# Patient Record
Sex: Female | Born: 1935 | Race: White | Hispanic: No | Marital: Married | Smoking: Never smoker
Health system: Southern US, Community
[De-identification: ages and names within clinical notes are randomized; demographics above are authoritative.]

## PROBLEM LIST (undated history)

## (undated) DIAGNOSIS — I82409 Acute embolism and thrombosis of unspecified deep veins of unspecified lower extremity: Secondary | ICD-10-CM

## (undated) DIAGNOSIS — M1990 Unspecified osteoarthritis, unspecified site: Secondary | ICD-10-CM

## (undated) HISTORY — PX: OTHER SURGICAL HISTORY: SHX169

## (undated) HISTORY — PX: BREAST SURGERY: SHX581

## (undated) HISTORY — PX: ABDOMINAL HYSTERECTOMY: SHX81

## (undated) HISTORY — PX: CHOLECYSTECTOMY: SHX55

---

## 2004-04-01 ENCOUNTER — Other Ambulatory Visit: Payer: Self-pay

## 2004-04-11 ENCOUNTER — Other Ambulatory Visit: Payer: Self-pay

## 2004-12-19 ENCOUNTER — Ambulatory Visit: Payer: Self-pay | Admitting: Internal Medicine

## 2005-12-23 ENCOUNTER — Ambulatory Visit: Payer: Self-pay | Admitting: Internal Medicine

## 2007-02-24 ENCOUNTER — Ambulatory Visit: Payer: Self-pay | Admitting: Internal Medicine

## 2008-03-24 ENCOUNTER — Ambulatory Visit: Payer: Self-pay | Admitting: Internal Medicine

## 2009-05-17 ENCOUNTER — Ambulatory Visit: Payer: Self-pay | Admitting: Internal Medicine

## 2014-09-28 ENCOUNTER — Emergency Department: Payer: Self-pay | Admitting: Emergency Medicine

## 2015-01-29 ENCOUNTER — Emergency Department: Payer: Medicare Other

## 2015-01-29 ENCOUNTER — Inpatient Hospital Stay
Admission: EM | Admit: 2015-01-29 | Discharge: 2015-01-31 | DRG: 166 | Disposition: A | Discharge status: Home / Self Care | Payer: Medicare Other | Attending: Internal Medicine | Admitting: Internal Medicine

## 2015-01-29 ENCOUNTER — Inpatient Hospital Stay: Payer: Medicare Other

## 2015-01-29 ENCOUNTER — Encounter: Payer: Self-pay | Admitting: Emergency Medicine

## 2015-01-29 ENCOUNTER — Inpatient Hospital Stay (HOSPITAL_COMMUNITY)
Admit: 2015-01-29 | Discharge: 2015-01-29 | Disposition: A | Discharge status: Home / Self Care | Payer: Medicare Other | Attending: Internal Medicine | Admitting: Internal Medicine

## 2015-01-29 DIAGNOSIS — I10 Essential (primary) hypertension: Secondary | ICD-10-CM | POA: Diagnosis present

## 2015-01-29 DIAGNOSIS — R0602 Shortness of breath: Secondary | ICD-10-CM

## 2015-01-29 DIAGNOSIS — Z79899 Other long term (current) drug therapy: Secondary | ICD-10-CM

## 2015-01-29 DIAGNOSIS — Z90710 Acquired absence of both cervix and uterus: Secondary | ICD-10-CM

## 2015-01-29 DIAGNOSIS — I351 Nonrheumatic aortic (valve) insufficiency: Secondary | ICD-10-CM

## 2015-01-29 DIAGNOSIS — I2699 Other pulmonary embolism without acute cor pulmonale: Secondary | ICD-10-CM | POA: Diagnosis present

## 2015-01-29 DIAGNOSIS — Z95828 Presence of other vascular implants and grafts: Secondary | ICD-10-CM

## 2015-01-29 DIAGNOSIS — J9601 Acute respiratory failure with hypoxia: Secondary | ICD-10-CM | POA: Diagnosis present

## 2015-01-29 DIAGNOSIS — Z9049 Acquired absence of other specified parts of digestive tract: Secondary | ICD-10-CM | POA: Diagnosis present

## 2015-01-29 DIAGNOSIS — Z7982 Long term (current) use of aspirin: Secondary | ICD-10-CM | POA: Diagnosis not present

## 2015-01-29 DIAGNOSIS — I82403 Acute embolism and thrombosis of unspecified deep veins of lower extremity, bilateral: Secondary | ICD-10-CM | POA: Diagnosis present

## 2015-01-29 DIAGNOSIS — Z86718 Personal history of other venous thrombosis and embolism: Secondary | ICD-10-CM | POA: Diagnosis not present

## 2015-01-29 DIAGNOSIS — M1990 Unspecified osteoarthritis, unspecified site: Secondary | ICD-10-CM | POA: Diagnosis present

## 2015-01-29 DIAGNOSIS — I248 Other forms of acute ischemic heart disease: Secondary | ICD-10-CM | POA: Diagnosis present

## 2015-01-29 DIAGNOSIS — Z66 Do not resuscitate: Secondary | ICD-10-CM | POA: Diagnosis present

## 2015-01-29 DIAGNOSIS — J9600 Acute respiratory failure, unspecified whether with hypoxia or hypercapnia: Secondary | ICD-10-CM | POA: Diagnosis present

## 2015-01-29 DIAGNOSIS — Z9181 History of falling: Secondary | ICD-10-CM | POA: Diagnosis not present

## 2015-01-29 DIAGNOSIS — I82409 Acute embolism and thrombosis of unspecified deep veins of unspecified lower extremity: Secondary | ICD-10-CM | POA: Diagnosis present

## 2015-01-29 HISTORY — DX: Acute embolism and thrombosis of unspecified deep veins of unspecified lower extremity: I82.409

## 2015-01-29 HISTORY — DX: Unspecified osteoarthritis, unspecified site: M19.90

## 2015-01-29 LAB — COMPREHENSIVE METABOLIC PANEL
ALK PHOS: 64 U/L (ref 38–126)
ALT: 15 U/L (ref 14–54)
AST: 25 U/L (ref 15–41)
Albumin: 3.7 g/dL (ref 3.5–5.0)
Anion gap: 11 (ref 5–15)
BUN: 14 mg/dL (ref 6–20)
CO2: 25 mmol/L (ref 22–32)
CREATININE: 0.74 mg/dL (ref 0.44–1.00)
Calcium: 8.9 mg/dL (ref 8.9–10.3)
Chloride: 103 mmol/L (ref 101–111)
GFR calc Af Amer: >60 mL/min (ref >60)
Glucose, Bld: 314 mg/dL — ABNORMAL HIGH (ref 65–99)
POTASSIUM: 3.8 mmol/L (ref 3.5–5.1)
Sodium: 139 mmol/L (ref 135–145)
Total Bilirubin: 0.6 mg/dL (ref 0.3–1.2)
Total Protein: 6.6 g/dL (ref 6.5–8.1)

## 2015-01-29 LAB — CBC
HEMATOCRIT: 39.7 % (ref 35.0–47.0)
Hemoglobin: 12.7 g/dL (ref 12.0–16.0)
MCH: 27.5 pg (ref 26.0–34.0)
MCHC: 31.9 g/dL — ABNORMAL LOW (ref 32.0–36.0)
MCV: 86.1 fL (ref 80.0–100.0)
Platelets: 208 10*3/uL (ref 150–440)
RBC: 4.61 MIL/uL (ref 3.80–5.20)
RDW: 15.6 % — ABNORMAL HIGH (ref 11.5–14.5)
WBC: 9.6 10*3/uL (ref 3.6–11.0)

## 2015-01-29 LAB — PROTIME-INR
INR: 1.05
Prothrombin Time: 13.9 seconds (ref 11.4–15.0)

## 2015-01-29 LAB — FIBRIN DERIVATIVES D-DIMER (ARMC ONLY)

## 2015-01-29 LAB — CK: Total CK: 51 U/L (ref 38–234)

## 2015-01-29 LAB — APTT: aPTT: 24 seconds (ref 24–36)

## 2015-01-29 LAB — TROPONIN I
Troponin I: 0.12 ng/mL — ABNORMAL HIGH (ref <0.031)
Troponin I: 1.18 ng/mL — ABNORMAL HIGH (ref <0.031)
Troponin I: 0.82 ng/mL — ABNORMAL HIGH (ref <0.031)

## 2015-01-29 LAB — HEPARIN LEVEL (UNFRACTIONATED): HEPARIN UNFRACTIONATED: 0.77 [IU]/mL — AB (ref 0.30–0.70)

## 2015-01-29 MED ORDER — MORPHINE SULFATE 2 MG/ML IJ SOLN: 2.0000 mg | INTRAMUSCULAR | Status: DC | PRN

## 2015-01-29 MED ORDER — IPRATROPIUM-ALBUTEROL 0.5-2.5 (3) MG/3ML IN SOLN
3.0000 mL | Freq: Four times a day (QID) | RESPIRATORY_TRACT | Status: DC
  Administered 2015-01-29 – 2015-01-31 (×7): 3 mL via RESPIRATORY_TRACT
  Filled 2015-01-29 (×8): qty 3

## 2015-01-29 MED ORDER — DOCUSATE SODIUM 100 MG PO CAPS
100.0000 mg | ORAL_CAPSULE | Freq: Two times a day (BID) | ORAL | Status: DC
  Administered 2015-01-29 – 2015-01-31 (×5): 100 mg via ORAL
  Filled 2015-01-29 (×5): qty 1

## 2015-01-29 MED ORDER — ONDANSETRON HCL 4 MG/2ML IJ SOLN: 4.0000 mg | Freq: Four times a day (QID) | INTRAMUSCULAR | Status: DC | PRN

## 2015-01-29 MED ORDER — PANTOPRAZOLE SODIUM 40 MG IV SOLR
40.0000 mg | Freq: Two times a day (BID) | INTRAVENOUS | Status: DC
  Administered 2015-01-29 – 2015-01-31 (×5): 40 mg via INTRAVENOUS
  Filled 2015-01-29 (×5): qty 40

## 2015-01-29 MED ORDER — HEPARIN BOLUS VIA INFUSION
3500.0000 [IU] | Freq: Once | INTRAVENOUS | Status: AC
Start: 2015-01-29 — End: 2015-01-29
  Administered 2015-01-29: 3500 [IU] via INTRAVENOUS
  Filled 2015-01-29: qty 3500

## 2015-01-29 MED ORDER — IOHEXOL 350 MG/ML SOLN
75.0000 mL | Freq: Once | INTRAVENOUS | Status: AC | PRN
  Administered 2015-01-29: 75 mL via INTRAVENOUS

## 2015-01-29 MED ORDER — BISACODYL 10 MG RE SUPP: 10.0000 mg | Freq: Every day | RECTAL | Status: DC | PRN

## 2015-01-29 MED ORDER — ACETAMINOPHEN 650 MG RE SUPP: 650.0000 mg | Freq: Four times a day (QID) | RECTAL | Status: DC | PRN

## 2015-01-29 MED ORDER — ACETAMINOPHEN 325 MG PO TABS: 650.0000 mg | ORAL_TABLET | Freq: Four times a day (QID) | ORAL | Status: DC | PRN

## 2015-01-29 MED ORDER — IPRATROPIUM-ALBUTEROL 0.5-2.5 (3) MG/3ML IN SOLN
RESPIRATORY_TRACT | Status: AC
  Administered 2015-01-29: 3 mL
  Filled 2015-01-29: qty 3

## 2015-01-29 MED ORDER — SODIUM CHLORIDE 0.9 % IV SOLN
INTRAVENOUS | Status: DC
  Administered 2015-01-29: 14:00:00 via INTRAVENOUS

## 2015-01-29 MED ORDER — ONDANSETRON HCL 4 MG PO TABS: 4.0000 mg | ORAL_TABLET | Freq: Four times a day (QID) | ORAL | Status: DC | PRN

## 2015-01-29 MED ORDER — HEPARIN SODIUM (PORCINE) 5000 UNIT/ML IJ SOLN
INTRAMUSCULAR | Status: AC
  Filled 2015-01-29: qty 1

## 2015-01-29 MED ORDER — SODIUM CHLORIDE 0.9 % IJ SOLN
3.0000 mL | Freq: Two times a day (BID) | INTRAMUSCULAR | Status: DC
  Administered 2015-01-29 – 2015-01-31 (×5): 3 mL via INTRAVENOUS

## 2015-01-29 MED ORDER — ASPIRIN EC 325 MG PO TBEC
325.0000 mg | DELAYED_RELEASE_TABLET | ORAL | Status: DC
  Administered 2015-01-30 – 2015-01-31 (×2): 325 mg via ORAL
  Filled 2015-01-29 (×2): qty 1

## 2015-01-29 MED ORDER — CEFTRIAXONE SODIUM IN DEXTROSE 20 MG/ML IV SOLN
1.0000 g | INTRAVENOUS | Status: DC
  Administered 2015-01-29: 1 g via INTRAVENOUS
  Filled 2015-01-29 (×3): qty 50

## 2015-01-29 MED ORDER — HEPARIN (PORCINE) IN NACL 100-0.45 UNIT/ML-% IJ SOLN
950.0000 [IU]/h | INTRAMUSCULAR | Status: DC
  Administered 2015-01-29: 1100 [IU]/h via INTRAVENOUS
  Administered 2015-01-30: 950 [IU]/h via INTRAVENOUS
  Filled 2015-01-29 (×5): qty 250

## 2015-01-29 NOTE — Progress Notes (Signed)
Dr. Doy Hutching made aware of 2nd troponin of 1.18 and + venous dopplers.  Orders received.

## 2015-01-29 NOTE — ED Notes (Signed)
Report given to Lavella Lemons, Therapist, sports.  Patient in [place], will transfer patient to floor once ultrasound is complete.

## 2015-01-29 NOTE — ED Provider Notes (Signed)
[facility] Emergency Department Provider Note  ____________________________________________  Time seen: 10:05 AM  I have reviewed the triage vital signs and the nursing notes.   HISTORY  Chief Complaint Fall and Shortness of Breath      HPI Courtney Simon is a 81 y.o. female presents with a history of syncopal episode this morning after using the bathroom. Patient states she urinated subsequently stood up and lost consciousness. Per patient's husband on his arrival to the bathroom the patient was incoherent eyes were open but was not responsive. In addition patient has been noted that she was breathing rapidly. Patient presents to the emergency department tachypnea Tachycardic in  apparent distress. Of note patient has a history of DVTs in the left lower extremity "many years ago".     Past Medical History  Diagnosis Date  . DVT (deep venous thrombosis)   . Arthritis     Patient Active Problem List   Diagnosis Date Noted  . Pulmonary embolism 01/29/2015  . Acute respiratory failure 01/29/2015  . SOB (shortness of breath) 01/29/2015    Past Surgical History  Procedure Laterality Date  . Breast surgery    . Abdominal hysterectomy    . Cholecystectomy      Current Outpatient Rx  Name  Route  Sig  Dispense  Refill  . aspirin EC 325 MG tablet   Oral   Take 325 mg by mouth every morning.         . Aspirin-Salicylamide-Caffeine (ARTHRITIS STRENGTH BC POWDER PO)   Oral   Take 1 packet by mouth daily as needed. For pain.           Allergies Review of patient's allergies indicates no known allergies.  History reviewed. No pertinent family history.  Social History History  Substance Use Topics  . Smoking status: Never Smoker   . Smokeless tobacco: Not on file  . Alcohol Use: No    Review of Systems  Constitutional: Negative for fever. Eyes: Negative for visual changes. ENT: Negative for sore throat. Cardiovascular: Positive for  chest pain. Respiratory: Positive for shortness of breath. Gastrointestinal: Negative for abdominal pain, vomiting and diarrhea. Genitourinary: Negative for dysuria. Musculoskeletal: Negative for back pain. Skin: Negative for rash. Neurological: Negative for headaches, focal weakness or numbness.   10-point ROS otherwise negative.  ____________________________________________   PHYSICAL EXAM:  VITAL SIGNS: ED Triage Vitals  Enc Vitals Group     BP 01/29/15 0950 144/60 mmHg     Pulse Rate 01/29/15 0950 109     Resp 01/29/15 0950 20     Temp 01/29/15 0950 97.7 F (36.5 C)     Temp Source 01/29/15 0950 Oral     SpO2 01/29/15 0950 95 %     Weight 01/29/15 0950 153 lb (69.4 kg)     Height 01/29/15 0950 5\' 6"  (1.676 m)     Head Cir --      Peak Flow --      Pain Score 01/29/15 1104 0     Pain Loc --      Pain Edu? --      Excl. in [place]? --      Constitutional: Alert and oriented. Apparent distress Eyes: Conjunctivae are normal. PERRL. Normal extraocular movements. ENT   Head: Normocephalic and atraumatic.   Nose: No congestion/rhinnorhea.   Mouth/Throat: Mucous membranes are moist.   Neck: No stridor. Cardiovascular: Sinus tachycardia. Normal and symmetric distal pulses are present in all extremities. No murmurs, rubs, or  gallops. Respiratory: Normal respiratory effort without tachypnea nor retractions. Breath sounds are clear and equal bilaterally. No wheezes/rales/rhonchi. Gastrointestinal: Soft and nontender. No distention. There is no CVA tenderness. Genitourinary: deferred Musculoskeletal: Nontender with normal range of motion in all extremities. No joint effusions.  No lower extremity tenderness nor edema. Neurologic:  Normal speech and language. No gross focal neurologic deficits are appreciated. Speech is normal.  Skin:  Skin is warm, dry and intact. No rash noted. Psychiatric: Mood and affect are normal. Speech and behavior are normal. Patient exhibits  appropriate insight and judgment.  ____________________________________________    LABS (pertinent positives/negatives)  Labs Reviewed  CBC - Abnormal; Notable for the following:    MCHC 31.9 (*)    RDW 15.6 (*)    All other components within normal limits  COMPREHENSIVE METABOLIC PANEL - Abnormal; Notable for the following:    Glucose, Bld 314 (*)    All other components within normal limits  TROPONIN I - Abnormal; Notable for the following:    Troponin I 0.12 (*)    All other components within normal limits  FIBRIN DERIVATIVES D-DIMER ([facility] ONLY) - Abnormal; Notable for the following:    Fibrin derivatives D-dimer ([facility]) >10000 (*)    All other components within normal limits  CK  HEPARIN LEVEL (UNFRACTIONATED)     ____________________________________________   EKG  ED ECG REPORT I, Zian Delair, Florence N, the attending physician, personally viewed and interpreted this ECG.   Date: 01/29/2015  EKG Time: 9:48 AM  Rate: 111  Rhythm: Sinus tachycardia  Axis: Right axis deviation  Intervals:normal  ST&T Change: none   ____________________________________________    RADIOLOGY  CT chest revealed:   IMPRESSION: 1. Extensive bilateral pulmonary emboli. Positive for acute PE with CT evidence of right heart strain (RV/LV Ratio = 1.17) consistent with at least submassive (intermediate risk) PE. The presence of right heart strain has been associated with an increased risk of morbidity and mortality. Critical Value/emergent results were called by telephone at the time of interpretation on 01/29/2015 at 12:33 pm to Dr. Marjean Donna , who verbally acknowledged these results. 2. Bilateral mild interstitial thickening and patchy areas of ground-glass opacity as can be seen with mild pulmonary edema. ____________________________________________   Critical Care performed: 60 minutes  ____________________________________________   INITIAL IMPRESSION / ASSESSMENT AND  PLAN / ED COURSE  Pertinent labs & imaging results that were available during my care of the patient were reviewed by me and considered in my medical decision making (see chart for details).  History physical exam and CAT scan consistent with multiple pulmonary emboli bilateral lungs with right heart strain. Patient given Lovenox 1 mg/kg subcutaneous. Patient was discussed with Dr. Doy Hutching regarding all clinical findings with plan for admission for continued anticoagulation therapy.  ____________________________________________   FINAL CLINICAL IMPRESSION(S) / ED DIAGNOSES  Final diagnoses:  Pulmonary embolism      Gregor Hams, MD 01/29/15 1314

## 2015-01-29 NOTE — Clinical Social Work Note (Signed)
Clinical Social Work Assessment  Patient Details  Name: Courtney Simon MRN: [id number] Date of Birth: July 17, 1921  Date of referral:  01/29/15               Reason for consult:  Discharge Planning                Permission sought to share information with:  Family Supports Permission granted to share information::  Yes, Verbal Permission Granted  Name::     Mikki Santee [phone]   and Maris Berger [phone] hm   [phone] cell (husband Mikki Santee and daughter Webb Silversmith)  Agency::     Relationship::     Contact Information:     Housing/Transportation Living arrangements for the past 2 months:  [place] of Information:  Patient, Adult Children, Spouse Patient Interpreter Needed:  None Criminal Activity/Legal Involvement Pertinent to Current Situation/Hospitalization:  No - Comment as needed Significant Relationships:  Adult Children, Other Family Members, Spouse Lives with:  Spouse Do you feel safe going back to the place where you live?  Yes Need for family participation in patient care:  Yes (Comment)  Care giving concerns:  No concerns reported at this time   Social Worker assessment / plan:  Patient reported to ED due to syncopal episode. Patient is hard of hearing, she is pleasant and references for her husband to provide all information.  Husband and daughter at bedside.  Patient lives with her husband of 60 years.  Their daughter Webb Silversmith lives next door and is a support system for patient.  Patient was very active, she did not utilize any assistance devices and enjoyed getting out prior to this episode.  Patient enjoys cleaning her home, cooking and doing laundry.  Patient has never been to rehab nor had home health services.  However, patient and family are open to all recommendation from MD and PT .  Disposition to be determined   Employment status:  Retired Forensic scientist:    PT Recommendations:  Not assessed at this time Information / Referral to community  resources:  Other (Comment Required) (pending)  Patient/Family's Response to care:  patient and family are open to all recommendation from MD and PT   Patient/Family's Understanding of and Emotional Response to Diagnosis, Current Treatment, and Prognosis:  Patient and family understand patient will be admitted and evaluated.   Emotional Assessment Appearance:  Appears stated age Attitude/Demeanor/Rapport:  Other (cooperative) Affect (typically observed):  Appropriate, Calm, Pleasant, Quiet Orientation:  Oriented to Self, Oriented to Place, Oriented to  Time, Oriented to Situation Alcohol / Substance use:  Never Used Psych involvement (Current and /or in the community):  No (Comment)  Discharge Needs  Concerns to be addressed:  Denies Needs/Concerns at this time Readmission within the last 30 days:  No Current discharge risk:  None (none identified at this time) Barriers to Discharge:  No Barriers Identified   Maurine Cane, LCSW 01/29/2015, 2:30 PM Casimer Lanius. Latanya Presser, MSW Clinical Social Work Department Emergency Room [phone] 2:37 PM

## 2015-01-29 NOTE — H&P (Signed)
History and Physical    Courtney Simon CMK:[id number] DOB: [birth year] DOA: 01/29/2015  Referring physician: Dr. Owens Shark PCP: Madelyn Brunner, MD  Specialists: none   Chief Complaint: SOB, CP  HPI: Courtney Simon is a 81 y.o. female has a past medical history significant for OA and remote hx of DVT now with acute onset of SOB, CP and tachypnea found to have submassive PE on CT in ER with right heart strain. Has remote hx of DVT treated with Lovenox. Now admitted for further evaluation.  Review of Systems: The patient denies anorexia, fever, weight loss,, vision loss, decreased hearing, hoarseness, chest pain, syncope, dyspnea on exertion, peripheral edema, balance deficits, hemoptysis, abdominal pain, melena, hematochezia, severe indigestion/heartburn, hematuria, incontinence, genital sores, muscle weakness, suspicious skin lesions, transient blindness, difficulty walking, depression, unusual weight change, abnormal bleeding, enlarged lymph nodes, angioedema, and breast masses.   Past Medical History  Diagnosis Date  . DVT (deep venous thrombosis)   . Arthritis    Past Surgical History  Procedure Laterality Date  . Breast surgery    . Abdominal hysterectomy    . Cholecystectomy     Social History:  reports that she has never smoked. She does not have any smokeless tobacco history on file. She reports that she does not drink alcohol. Her drug history is not on file.  No Known Allergies  History reviewed. No pertinent family history.  Prior to Admission medications   Medication Sig Start Date End Date Taking? Authorizing Provider  aspirin EC 325 MG tablet Take 325 mg by mouth every morning.   Yes Historical Provider, MD  Aspirin-Salicylamide-Caffeine (ARTHRITIS STRENGTH BC POWDER PO) Take 1 packet by mouth daily as needed. For pain.   Yes Historical Provider, MD   Physical Exam: Filed Vitals:   01/29/15 0950 01/29/15 1104  BP: 144/60 139/81  Pulse: 109 107  Temp: 97.7 F  (36.5 C)   TempSrc: Oral   Resp: 20 26  Height: 5\' 6"  (1.676 m)   Weight: 69.4 kg (153 lb)   SpO2: 95% 98%     General:  No apparent distress  Eyes: PERRL, EOMI, no scleral icterus  ENT: moist oropharynx  Neck: supple, no lymphadenopathy  Cardiovascular: rapid rate with regular rhythm without MRG; 2+ peripheral pulses, no JVD, no peripheral edema  Respiratory: diffuse rhonchi,without wheezing,  or crackles  Abdomen: soft, non tender to palpation, positive bowel sounds, no guarding, no rebound  Skin: no rashes  Musculoskeletal: normal bulk and tone, no joint swelling  Psychiatric: normal mood and affect  Neurologic: CN 2-12 grossly intact, MS 5/5 in all 4  Labs on Admission:  Basic Metabolic Panel:  Recent Labs Lab 01/29/15 1009  NA 139  K 3.8  CL 103  CO2 25  GLUCOSE 314*  BUN 14  CREATININE 0.74  CALCIUM 8.9   Liver Function Tests:  Recent Labs Lab 01/29/15 1009  AST 25  ALT 15  ALKPHOS 64  BILITOT 0.6  PROT 6.6  ALBUMIN 3.7   No results for input(s): LIPASE, AMYLASE in the last 168 hours. No results for input(s): AMMONIA in the last 168 hours. CBC:  Recent Labs Lab 01/29/15 1009  WBC 9.6  HGB 12.7  HCT 39.7  MCV 86.1  PLT 208   Cardiac Enzymes:  Recent Labs Lab 01/29/15 1009  CKTOTAL 51  TROPONINI 0.12*    BNP (last 3 results) No results for input(s): BNP in the last 8760 hours.  ProBNP (last 3 results)  No results for input(s): PROBNP in the last 8760 hours.  CBG: No results for input(s): GLUCAP in the last 168 hours.  Radiological Exams on Admission: Ct Head Wo Contrast  01/29/2015   CLINICAL DATA:  81 year old female with dizziness and syncope this morning in the bathroom. Slurred speech. Initial encounter.  EXAM: CT HEAD WITHOUT CONTRAST  TECHNIQUE: Contiguous axial images were obtained from the base of the skull through the vertex without intravenous contrast.  COMPARISON:  Head CT 04/01/2004 report (no images  available).  FINDINGS: Chronic benign exostosis from the left lateral skull all (series 3, image 18) described also in 2005. Mild hyperostosis front talus. No calvarium fracture. No scalp hematoma. No acute orbits soft tissue finding identified.  Visualized paranasal sinuses and mastoids are clear. There is a small 10 mm area of polypoid mucosal thickening or small superior nasal polyp on the right seen on series 3, image 8. Leftward nasal septal deviation incidentally noted.  Calcified atherosclerosis at the skull base. Mild dystrophic basal ganglia calcifications. Cerebral volume and ventricle size is concordant with age. Mild mostly periventricular white matter nonspecific hypodensity. Small chronic appearing lacunar infarct at the posterior right caudate. No evidence of cortically based acute infarction identified. No suspicious intracranial vascular hyperdensity. No acute intracranial hemorrhage identified.  IMPRESSION: 1. No acute intracranial abnormality. No acute traumatic injury identified. 2. Mild for age chronic small vessel disease. 3. Small superior right nasal cavity mucosal thickening or polyp (series 3, image 8), likely inconsequential.   Electronically Signed   By: Genevie Ann M.D.   On: 01/29/2015 12:04   Ct Angio Chest Pe W/cm &/or Wo Cm  01/29/2015   CLINICAL DATA:  Shortness of breath, chest discomfort  EXAM: CT ANGIOGRAPHY CHEST WITH CONTRAST  TECHNIQUE: Multidetector CT imaging of the chest was performed using the standard protocol during bolus administration of intravenous contrast. Multiplanar CT image reconstructions and MIPs were obtained to evaluate the vascular anatomy.  CONTRAST:  46mL OMNIPAQUE IOHEXOL 350 MG/ML SOLN  COMPARISON:  None.  FINDINGS: There is adequate opacification of the pulmonary arteries. There is extensive bilateral pulmonary embolus. There are pulmonary emboli in the right and left main pulmonary arteries. There is near occlusive pulmonary embolus in the left upper  lobe pulmonary artery segment. There are pulmonary emboli in the right lower lobe and left lower lobe lobar and segmental branches. The RV/ lb ratio is 1.17. There is leftward bowing of the interventricular septum. The appearance is most consistent with right heart strain.  The main pulmonary artery, right main pulmonary artery and left main pulmonary arteries are normal in size. The heart size is enlarged. There is no pericardial effusion.  There is a calcified right middle lobe pulmonary nodule consistent with sequela prior granulomatous disease. There is subpleural bilateral interstitial thickening with patchy areas of ground-glass opacity likely reflecting mild vascular congestion. There are no pleural effusions. There is no pneumothorax. There is a 4 mm lingular pulmonary nodule.  There is no axillary, hilar, or mediastinal adenopathy.  There is no lytic or blastic osseous lesion.  The visualized portions of the upper abdomen are unremarkable.  Review of the MIP images confirms the above findings.  IMPRESSION: 1. Extensive bilateral pulmonary emboli. Positive for acute PE with CT evidence of right heart strain (RV/LV Ratio = 1.17) consistent with at least submassive (intermediate risk) PE. The presence of right heart strain has been associated with an increased risk of morbidity and mortality. Critical Value/emergent results were called by telephone  at the time of interpretation on 01/29/2015 at 12:33 pm to Dr. Marjean Donna , who verbally acknowledged these results. 2. Bilateral mild interstitial thickening and patchy areas of ground-glass opacity as can be seen with mild pulmonary edema.   Electronically Signed   By: Kathreen Devoid   On: 01/29/2015 12:36    EKG: Independently reviewed.  Assessment/Plan Principal Problem:   Pulmonary embolism Active Problems:   Acute respiratory failure   SOB (shortness of breath)   Will admit to floor on Heparin drip with empiric IV ABX and SVN's. Follow enzymes.  Order echo and LE dopplers. Consult Vascular Surgery. Guaiac stools and follow hgb closely. Repeat labs in AM.  Diet: clear liq  Fluids: NS@50  DVT Prophylaxis: Heparin  Code Status: DNR  Family Communication: yes  Disposition Plan: home  Time spent: 50 min

## 2015-01-29 NOTE — ED Notes (Signed)
Patient to CT scan via stretcher.  AAOx3.  Skin warm and dry.

## 2015-01-29 NOTE — Progress Notes (Signed)
81 yo wf admitted to room 246 via stretcher from ED with bil PE.  A&O x4, no distress on 3 LO2 per . Cardiac monitor applied, pt denies chest pain at this time.  Lungs diminished bil with scattered wheezes.  Abdomen benign.  Heparin gtt at 1100 units/hr infusing well lt wrist along with IVF.  Oriented to room and surrroundings.  POC reviewed with pt/family.  Denies need.  CB in reach, SR up x 2, bed alarm on.

## 2015-01-29 NOTE — Progress Notes (Signed)
ANTICOAGULATION CONSULT NOTE - Initial Consult  Pharmacy Consult for Heparin Drip Management Indication: pulmonary embolus  No Known Allergies  Patient Measurements: Height: 5\' 6"  (167.6 cm) Weight: 153 lb (69.4 kg) IBW/kg (Calculated) : 59.3   Vital Signs: Temp: 97.7 F (36.5 C) (07/17 0950) Temp Source: Oral (07/17 0950) BP: 139/81 mmHg (07/17 1104) Pulse Rate: 107 (07/17 1104)  Labs:  Recent Labs  01/29/15 1009  HGB 12.7  HCT 39.7  PLT 208  CREATININE 0.74  CKTOTAL 51  TROPONINI 0.12*    Estimated Creatinine Clearance: 42.9 mL/min (by C-G formula based on Cr of 0.74).   Medical History: Past Medical History  Diagnosis Date  . DVT (deep venous thrombosis)   . Arthritis     Medications:  Scheduled:  . heparin  3,500 Units Intravenous Once  . ipratropium-albuterol  3 mL Nebulization QID  . pantoprazole (PROTONIX) IV  40 mg Intravenous Q12H   Infusions:  . sodium chloride    . cefTRIAXone (ROCEPHIN)  IV    . heparin      Assessment: Pharmacy consulted to dose heparin drip for 81 yo female being treated for PE. MD note states patient received enoxparin x 1, however confirmed with nurse that enoxaparin has not been ordered or administered.   Goal of Therapy:  Heparin level 0.3-0.7 units/ml Monitor platelets by anticoagulation protocol: Yes   Plan:  Give 3500 units bolus x 1 Start heparin infusion at 1100 units/hr Check anti-Xa level in 8 hours and daily while on heparin Continue to monitor H&H and platelets  Pharmacy will continue to monitor and adjust per consult.  Keyvin Rison L 01/29/2015,1:18 PM

## 2015-01-29 NOTE — ED Notes (Signed)
Pt via ems from home after falling from toilet; she doesn't remember whether she lost consciousness. Husband states she was disoriented and unable to speak when he found her. She has had SOB "for a while" but it has been worse since an URI in June. She completed course of amoxicillin and prednisone.

## 2015-01-29 NOTE — ED Notes (Signed)
To US via stretcher.  AAOx3.  Skin warm and dry.  

## 2015-01-29 NOTE — ED Notes (Signed)
Pt via ems from home after falling from toilet. Unsure whether LOC occurred. Husband found her and states she was disoriented, couldn't speak, and her "eyes looked funny." Pt alert & oriented

## 2015-01-30 ENCOUNTER — Encounter
Admission: EM | Disposition: A | Discharge status: Home / Self Care | Payer: Self-pay | Source: Home / Self Care | Attending: Internal Medicine

## 2015-01-30 DIAGNOSIS — I82409 Acute embolism and thrombosis of unspecified deep veins of unspecified lower extremity: Secondary | ICD-10-CM | POA: Diagnosis present

## 2015-01-30 HISTORY — PX: PERIPHERAL VASCULAR CATHETERIZATION: SHX172C

## 2015-01-30 LAB — CBC
HEMATOCRIT: 35.8 % (ref 35.0–47.0)
Hemoglobin: 11.8 g/dL — ABNORMAL LOW (ref 12.0–16.0)
MCH: 28 pg (ref 26.0–34.0)
MCHC: 32.8 g/dL (ref 32.0–36.0)
MCV: 85.2 fL (ref 80.0–100.0)
PLATELETS: 194 10*3/uL (ref 150–440)
RBC: 4.2 MIL/uL (ref 3.80–5.20)
RDW: 15.6 % — AB (ref 11.5–14.5)
WBC: 9.4 10*3/uL (ref 3.6–11.0)

## 2015-01-30 LAB — COMPREHENSIVE METABOLIC PANEL
ALT: 13 U/L — ABNORMAL LOW (ref 14–54)
AST: 23 U/L (ref 15–41)
Albumin: 3 g/dL — ABNORMAL LOW (ref 3.5–5.0)
Alkaline Phosphatase: 52 U/L (ref 38–126)
Anion gap: 4 — ABNORMAL LOW (ref 5–15)
BUN: 7 mg/dL (ref 6–20)
CALCIUM: 8.1 mg/dL — AB (ref 8.9–10.3)
CO2: 26 mmol/L (ref 22–32)
Chloride: 110 mmol/L (ref 101–111)
Creatinine, Ser: 0.68 mg/dL (ref 0.44–1.00)
GFR calc non Af Amer: >60 mL/min (ref >60)
Glucose, Bld: 150 mg/dL — ABNORMAL HIGH (ref 65–99)
Potassium: 3.7 mmol/L (ref 3.5–5.1)
SODIUM: 140 mmol/L (ref 135–145)
Total Bilirubin: 0.6 mg/dL (ref 0.3–1.2)
Total Protein: 5.4 g/dL — ABNORMAL LOW (ref 6.5–8.1)

## 2015-01-30 LAB — OCCULT BLOOD X 1 CARD TO LAB, STOOL: Fecal Occult Bld: NEGATIVE

## 2015-01-30 LAB — TROPONIN I: Troponin I: 0.45 ng/mL — ABNORMAL HIGH (ref <0.031)

## 2015-01-30 LAB — HEPARIN LEVEL (UNFRACTIONATED)
Heparin Unfractionated: 0.98 IU/mL — ABNORMAL HIGH (ref 0.30–0.70)
Heparin Unfractionated: 0.57 IU/mL (ref 0.30–0.70)

## 2015-01-30 SURGERY — IVC FILTER INSERTION
Anesthesia: Moderate Sedation

## 2015-01-30 MED ORDER — MIDAZOLAM HCL 2 MG/2ML IJ SOLN
2.0000 mg | Freq: Once | INTRAMUSCULAR | Status: AC
  Administered 2015-01-30: 2 mg via INTRAVENOUS

## 2015-01-30 MED ORDER — GUAIFENESIN-DM 100-10 MG/5ML PO SYRP
5.0000 mL | ORAL_SOLUTION | ORAL | Status: DC | PRN
Start: 2015-01-30 — End: 2015-01-31

## 2015-01-30 MED ORDER — FENTANYL CITRATE (PF) 100 MCG/2ML IJ SOLN
50.0000 ug | Freq: Once | INTRAMUSCULAR | Status: AC
  Administered 2015-01-30: 50 ug via INTRAVENOUS

## 2015-01-30 MED ORDER — CEFAZOLIN SODIUM 1-5 GM-% IV SOLN
1.0000 g | Freq: Once | INTRAVENOUS | Status: AC
  Administered 2015-01-30: 1 g via INTRAVENOUS

## 2015-01-30 MED ORDER — IOHEXOL 300 MG/ML  SOLN
INTRAMUSCULAR | Status: DC | PRN
  Administered 2015-01-30: 15 mL via INTRAVENOUS

## 2015-01-30 MED ORDER — SODIUM CHLORIDE 0.9 % IV SOLN: INTRAVENOUS | Status: DC

## 2015-01-30 MED ORDER — MIDAZOLAM HCL 5 MG/5ML IJ SOLN
INTRAMUSCULAR | Status: AC
  Filled 2015-01-30: qty 5

## 2015-01-30 MED ORDER — DEXTROSE 5 % IV SOLN
1.0000 g | INTRAVENOUS | Status: DC
  Administered 2015-01-30 – 2015-01-31 (×2): 1 g via INTRAVENOUS
  Filled 2015-01-30 (×4): qty 10

## 2015-01-30 MED ORDER — LIDOCAINE-EPINEPHRINE (PF) 1 %-1:200000 IJ SOLN
INTRAMUSCULAR | Status: AC
  Filled 2015-01-30: qty 30

## 2015-01-30 MED ORDER — CHLORHEXIDINE GLUCONATE CLOTH 2 % EX PADS
6.0000 | MEDICATED_PAD | Freq: Once | CUTANEOUS | Status: DC
Start: 2015-01-30 — End: 2015-01-30

## 2015-01-30 MED ORDER — FENTANYL CITRATE (PF) 100 MCG/2ML IJ SOLN
INTRAMUSCULAR | Status: AC
  Filled 2015-01-30: qty 2

## 2015-01-30 MED ORDER — CEFAZOLIN SODIUM 1-5 GM-% IV SOLN
INTRAVENOUS | Status: AC
  Filled 2015-01-30: qty 50

## 2015-01-30 MED ORDER — HEPARIN (PORCINE) IN NACL 2-0.9 UNIT/ML-% IJ SOLN
INTRAMUSCULAR | Status: AC
Start: 2015-01-30 — End: 2015-01-30
  Filled 2015-01-30: qty 500

## 2015-01-30 MED ORDER — ONDANSETRON HCL 4 MG/2ML IJ SOLN
INTRAMUSCULAR | Status: AC
  Administered 2015-01-30: 4 mg
  Filled 2015-01-30: qty 2

## 2015-01-30 SURGICAL SUPPLY — 3 items
FILTER VC CELECT-FEMORAL (Filter) ×3 IMPLANT
PACK ANGIOGRAPHY (CUSTOM PROCEDURE TRAY) ×3 IMPLANT
WIRE J 3MM .035X145CM (WIRE) ×3 IMPLANT

## 2015-01-30 NOTE — Progress Notes (Signed)
ANTICOAGULATION CONSULT NOTE - Follow Up Consult  Pharmacy Consult for Heparin Indication: pulmonary embolus  No Known Allergies  Patient Measurements: Height: 5\' 6"  (167.6 cm) Weight: 152 lb 3.2 oz (69.037 kg) IBW/kg (Calculated) : 59.3  Vital Signs: Temp: 97.8 F (36.6 C) (07/18 1351) Temp Source: Oral (07/18 1351) BP: 145/81 mmHg (07/18 1441) Pulse Rate: 79 (07/18 1441)  Labs:  Recent Labs  01/29/15 1009 01/29/15 1535 01/29/15 2128 01/30/15 0400 01/30/15 0908 01/30/15 1753  HGB 12.7  --   --  11.8*  --   --   HCT 39.7  --   --  35.8  --   --   PLT 208  --   --  194  --   --   APTT 24  --   --   --   --   --   LABPROT 13.9  --   --   --   --   --   INR 1.05  --   --   --   --   --   HEPARINUNFRC  --   --  0.77*  --  0.98* 0.57  CREATININE 0.74  --   --  0.68  --   --   CKTOTAL 51  --   --   --   --   --   TROPONINI 0.12* 1.18* 0.82* 0.45*  --   --     Estimated Creatinine Clearance: 42.9 mL/min (by C-G formula based on Cr of 0.68).   Medications:  Scheduled:  . aspirin EC  325 mg Oral [id number]  . cefTRIAXone (ROCEPHIN) IVPB 1 gram/50 mL D5W  1 g Intravenous Q24H  . docusate sodium  100 mg Oral BID  . ipratropium-albuterol  3 mL Nebulization QID  . pantoprazole (PROTONIX) IV  40 mg Intravenous Q12H  . sodium chloride  3 mL Intravenous Q12H   Infusions:  . heparin 950 Units/hr (01/30/15 1339)   PRN: acetaminophen **OR** acetaminophen, bisacodyl, guaiFENesin-dextromethorphan, morphine injection, ondansetron **OR** ondansetron (ZOFRAN) IV  Assessment: 81 y/o F on heparin drip for PE with HL at goal.   Goal of Therapy:  Heparin level 0.3-0.7 units/ml Monitor platelets by anticoagulation protocol: Yes   Plan:  Will continue heparin drip at 950 units/hr and recheck a HL in 8 h.   Ulice Dash D 01/30/2015,7:16 PM

## 2015-01-30 NOTE — Consult Note (Signed)
[facility] Vascular Consult Note  MRN : [id number]  Courtney Simon is a 81 y.o. (04/02/1922) female who presents with chief complaint of  Chief Complaint  Patient presents with  . Fall  . Shortness of Breath  .  History of Present Illness: Patient is admitted yesterday for submassive pulmonary embolus with right heart strain. She had been complaining of shortness of breath and had a fall. She had had some chest pain along with her shortness of breath. This has been getting worse over a few days.  Nothing at home was helping. There was not trauma or recent surgery as an inciting event.  Her mobility is poor. Her troponins are mildly elevated and she underwent a CT scan of the chest which I have independently reviewed. This demonstrates submassive pulmonary embolus with possible right heart strain. She has a previous history of DVT some years ago and had been on anticoagulation at that time completing therapy. Her ultrasound of the lower extremities performed yesterday also demonstrates bilateral lower extremity DVT with thrombus in the left femoral vein and in the right popliteal vein. She still has shortness of breath although slightly improved with anticoagulation. Her husband provides some of the history. She has had no hypotension. Cardiology has seen the patient and feels her troponin is a demand ischemia issue with her submassive pulmonary embolus. For this reason as well as the fact that she may not be a good candidate for long-term anticoagulation, we're consult and for evaluation for IVC filter. It is felt with her submassive pulmonary embolus if she has any further embolization this could be fatal.  Current Facility-Administered Medications  Medication Dose Route Frequency Provider Last Rate Last Dose  . 0.9 %  sodium chloride infusion   Intravenous Continuous Idelle Crouch, MD 50 mL/hr at 01/29/15 1900    . 0.9 %  sodium chloride infusion   Intravenous  Continuous Algernon Huxley, MD      . acetaminophen (TYLENOL) tablet 650 mg  650 mg Oral Q6H PRN Idelle Crouch, MD       Or  . acetaminophen (TYLENOL) suppository 650 mg  650 mg Rectal Q6H PRN Idelle Crouch, MD      . aspirin EC tablet 325 mg  325 mg Oral BH-q7a Idelle Crouch, MD   325 mg at 01/30/15 [phone]  . bisacodyl (DULCOLAX) suppository 10 mg  10 mg Rectal Daily PRN Idelle Crouch, MD      . cefTRIAXone (ROCEPHIN) 1 g in dextrose 5 % 50 mL IVPB  1 g Intravenous Q24H Srikar Sudini, MD      . Chlorhexidine Gluconate Cloth 2 % PADS 6 each  6 each Topical Once Algernon Huxley, MD      . docusate sodium (COLACE) capsule 100 mg  100 mg Oral BID Idelle Crouch, MD   100 mg at 01/30/15 0925  . heparin ADULT infusion 100 units/mL (25000 units/250 mL)  950 Units/hr Intravenous Continuous Jody C Barefoot, RPH 9.5 mL/hr at 01/30/15 1007 950 Units/hr at 01/30/15 1007  . ipratropium-albuterol (DUONEB) 0.5-2.5 (3) MG/3ML nebulizer solution 3 mL  3 mL Nebulization QID Idelle Crouch, MD   3 mL at 01/30/15 9371  . morphine 2 MG/ML injection 2 mg  2 mg Intravenous Q2H PRN Idelle Crouch, MD      . ondansetron [facility]) tablet 4 mg  4 mg Oral Q6H PRN Idelle Crouch, MD  Or  . ondansetron (ZOFRAN) injection 4 mg  4 mg Intravenous Q6H PRN Idelle Crouch, MD      . pantoprazole (PROTONIX) injection 40 mg  40 mg Intravenous Q12H Idelle Crouch, MD   40 mg at 01/30/15 0925  . sodium chloride 0.9 % injection 3 mL  3 mL Intravenous Q12H Idelle Crouch, MD   3 mL at 01/30/15 8676    Past Medical History  Diagnosis Date  . DVT (deep venous thrombosis)   . Arthritis     Past Surgical History  Procedure Laterality Date  . Breast surgery    . Abdominal hysterectomy    . Cholecystectomy    . Gum graft      Social History History  Substance Use Topics  . Smoking status: Never Smoker   . Smokeless tobacco: Not on file  . Alcohol Use: No  married, lives with spouse  Family History No  history of bleeding disorders, clotting disorders, or autoimmune diseases, or aneurysms.  No Known Allergies   REVIEW OF SYSTEMS (Negative unless checked)  Constitutional: [] Weight loss  [] Fever  [] Chills Cardiac: [x] Chest pain   [] Chest pressure   [] Palpitations   [] Shortness of breath when laying flat   [x] Shortness of breath at rest   [] Shortness of breath with exertion. Vascular:  [] Pain in legs with walking   [] Pain in legs at rest   [] Pain in legs when laying flat   [] Claudication   [] Pain in feet when walking  [] Pain in feet at rest  [] Pain in feet when laying flat   [x] History of DVT   [] Phlebitis   [x] Swelling in legs   [] Varicose veins   [] Non-healing ulcers Pulmonary:   [] Uses home oxygen   [] Productive cough   [] Hemoptysis   [] Wheeze  [] COPD   [] Asthma Neurologic:  [] Dizziness  [] Blackouts   [] Seizures   [] History of stroke   [] History of TIA  [] Aphasia   [] Temporary blindness   [] Dysphagia   [] Weakness or numbness in arms   [] Weakness or numbness in legs Musculoskeletal:  [] Arthritis   [] Joint swelling   [x] Joint pain   [] Low back pain Hematologic:  [] Easy bruising  [] Easy bleeding   [] Hypercoagulable state   [] Anemic  [] Hepatitis Gastrointestinal:  [] Blood in stool   [] Vomiting blood  [] Gastroesophageal reflux/heartburn   [] Difficulty swallowing. Genitourinary:  [] Chronic kidney disease   [] Difficult urination  [] Frequent urination  [] Burning with urination   [] Blood in urine Skin:  [] Rashes   [] Ulcers   [] Wounds Psychological:  [] History of anxiety   []  History of major depression.  Physical Examination  Filed Vitals:   01/29/15 2109 01/30/15 0408 01/30/15 0754 01/30/15 1134  BP: 133/64 138/48 159/66 180/100  Pulse: 101 86 82 86  Temp: 98 F (36.7 C) 97.8 F (36.6 C) 98.6 F (37 C) 98.2 F (36.8 C)  TempSrc: Oral Oral Oral Oral  Resp: 23 25 20 24   Height:      Weight:  152 lb 3.2 oz (69.037 kg)    SpO2: 97% 98% 98% 99%   Body mass index is 24.58 kg/(m^2). Gen:   WD/WN, NAD Head: [place]/AT, No temporalis wasting. Prominent temp pulse not noted. Ear/Nose/Throat: Hearing decreased, nares w/o erythema or drainage, oropharynx w/o Erythema/Exudate Eyes: PERRLA, EOMI.  Neck: Supple, no nuchal rigidity.  No bruit or JVD.  Pulmonary:  Good air movement, clear to auscultation bilaterally. Oxygen saturations greater than 97% on supplemental oxygen Cardiac: RRR, normal S1, S2, no Murmurs, rubs or gallops.  Vascular:  Vessel Right Left  Radial Palpable Palpable  Ulnar Palpable Palpable  Brachial Palpable Palpable  Carotid Palpable, without bruit Palpable, without bruit  Aorta Not palpable N/A  Femoral Palpable Palpable  Popliteal  not Palpable  not Palpable  PT  weakly Palpable  not Palpable  DP Palpable Palpable   Gastrointestinal: soft, non-tender/non-distended. No guarding/reflex. No masses, surgical incisions, or scars. Musculoskeletal: M/S 5/5 throughout.  Extremities without ischemic changes.  No deformity or atrophy. 1-2+ edema bilaterally. Neurologic: CN 2-12 intact. Pain and light touch intact in extremities.  Symmetrical.  Speech is fluent. Motor exam as listed above. Psychiatric: Awake and alert, Mood & affect appropriate for pt's clinical situation. Dermatologic: No rashes or ulcers noted.  No cellulitis or open wounds. Lymph : No Cervical, Axillary, or Inguinal lymphadenopathy.    CBC Lab Results  Component Value Date   WBC 9.4 01/30/2015   HGB 11.8* 01/30/2015   HCT 35.8 01/30/2015   MCV 85.2 01/30/2015   PLT 194 01/30/2015    BMET    Component Value Date/Time   NA 140 01/30/2015 0400   K 3.7 01/30/2015 0400   CL 110 01/30/2015 0400   CO2 26 01/30/2015 0400   GLUCOSE 150* 01/30/2015 0400   BUN 7 01/30/2015 0400   CREATININE 0.68 01/30/2015 0400   CALCIUM 8.1* 01/30/2015 0400   GFRNONAA >60 01/30/2015 0400   GFRAA >60 01/30/2015 0400   Estimated Creatinine Clearance: 42.9 mL/min (by C-G formula based on Cr of  0.68).  COAG Lab Results  Component Value Date   INR 1.05 01/29/2015   troponin slightly elevated yesterday at 1.1.  Radiology CT scan of the chest which I have reviewed. This demonstrates a submassive pulmonary embolus with possible evidence of right heart strain. Lower extremity venous duplex shows left femoral thrombus and right popliteal thrombus    Assessment/Plan 1. Submassive PE with right heart strain: Given the patient's submassive PE with already elevated troponin and right heart strain, further embolization could certainly be fatal. She has been started on anticoagulation and has tolerated this so far. It is concerning however given her age and history of falls that she would not be a good candidate for long-term anticoagulation anyway. If she can be anticoagulated in the short-term, I would certainly recommend that. I do think given the above findings that an IVC filter would be a reasonable option. This would be potentially removable in the future if she and her family would decide to have this removed. I have had a discussion with her husband today who is agreeable to proceed with her plan of care. An IVC filter will be placed today. I have discussed the risks and benefits of the filter including possible thrombosis as a risk factor. 2. Residual lower extremity DVT: It is likely that she has evidence of an old DVT in the right popliteal vein but also has thrombosis in the left femoral vein. She has a history of DVT but with a new PE this is likely a new finding as well. If she can be anticoagulated at least in the short-term I would recommend this to reduce chance of filter thrombosis and lower extremity postphlebitic symptoms. Leg elevation and compression stockings may be of benefit as well.   DEW,JASON, MD  01/30/2015 11:58 AM

## 2015-01-30 NOTE — Progress Notes (Signed)
Assessment completed.  IVF/Heparin gtt at 1100 units/hr infusing well lt wrist.  No active bleeding noted at this time.  Cardiac monitor in place, pt denies chest pain.  Lungs diminished bil, no distress on 3LO2 per [place].  Pt gets very short of breath upon movement.  Denies need at this time.  CB in reach, SR up x 2, bed alarm on.

## 2015-01-30 NOTE — Progress Notes (Signed)
75ml air removed from PAD, no bleeding noted, will monitor site before completely removing.

## 2015-01-30 NOTE — Progress Notes (Signed)
Heparin level .98, Heparin gtt decreased to 9500units/hr.  Next heparin level at 1800

## 2015-01-30 NOTE — Progress Notes (Signed)
ANTICOAGULATION CONSULT NOTE - Initial Consult  Pharmacy Consult for Heparin Drip Management Indication: pulmonary embolus  No Known Allergies  Patient Measurements: Height: 5\' 6"  (167.6 cm) Weight: 152 lb 3.2 oz (69.037 kg) IBW/kg (Calculated) : 59.3   Vital Signs: Temp: 98.6 F (37 C) (07/18 0754) Temp Source: Oral (07/18 0754) BP: 159/66 mmHg (07/18 0754) Pulse Rate: 82 (07/18 0754)  Labs:  Recent Labs  01/29/15 1009 01/29/15 1535 01/29/15 2128 01/30/15 0400 01/30/15 0908  HGB 12.7  --   --  11.8*  --   HCT 39.7  --   --  35.8  --   PLT 208  --   --  194  --   APTT 24  --   --   --   --   LABPROT 13.9  --   --   --   --   INR 1.05  --   --   --   --   HEPARINUNFRC  --   --  0.77*  --  0.98*  CREATININE 0.74  --   --  0.68  --   CKTOTAL 51  --   --   --   --   TROPONINI 0.12* 1.18* 0.82* 0.45*  --     Estimated Creatinine Clearance: 42.9 mL/min (by C-G formula based on Cr of 0.68).   Medical History: Past Medical History  Diagnosis Date  . DVT (deep venous thrombosis)   . Arthritis     Medications:  Scheduled:  . aspirin EC  325 mg Oral BH-q7a  . cefTRIAXone (ROCEPHIN) IVPB 1 gram/50 mL D5W  1 g Intravenous Q24H  . docusate sodium  100 mg Oral BID  . ipratropium-albuterol  3 mL Nebulization QID  . pantoprazole (PROTONIX) IV  40 mg Intravenous Q12H  . sodium chloride  3 mL Intravenous Q12H   Infusions:  . sodium chloride 50 mL/hr at 01/29/15 1900  . heparin 1,100 Units/hr (01/29/15 1900)    Assessment: Pharmacy consulted to dose heparin drip for 81 yo female being treated for PE. MD note states patient received enoxparin x 1, however confirmed with nurse that enoxaparin has not been ordered or administered.   Heparin level 0.98, supra-therapeutic   Goal of Therapy:  Heparin level 0.3-0.7 units/ml Monitor platelets by anticoagulation protocol: Yes   Plan:  Current orders for heparin 1100 units/hr, will decrease to 950 units/hr and recheck in  8 hours.  Pharmacy will continue to monitor and adjust per consult.   Rexene Edison, PharmD Clinical Pharmacist  01/30/2015,9:52 AM

## 2015-01-30 NOTE — OR Nursing (Signed)
Pt complaint of nausea.administered zofran states"felling better now"

## 2015-01-30 NOTE — Progress Notes (Signed)
PT Cancellation Note  Patient Details Name: NOVA SCHMUHL MRN: [id number] DOB: [birth year]   Cancelled Treatment:    Reason Eval/Treat Not Completed: Medical issues which prohibited therapy (Pt off floor for IVC filter placement; pt with acute PE; per policy will hold PT for 48 hours from the time and date of administration of anticoagulation medication--01/29/15 1449)).   Raquel Sarna Marifer Hurd 01/30/2015, 12:19 PM Leitha Bleak, [place]

## 2015-01-30 NOTE — Progress Notes (Signed)
Pt and husband requesting to speak with Dr. Lucky Cowboy before signing consent for IVC filter placement.

## 2015-01-30 NOTE — Consult Note (Signed)
[facility] Consultation Note  Patient ID: Courtney Simon, MRN: [id number], DOB/AGE: April 15, 1922 81 y.o. Admit date: 01/29/2015   Date of Consult: 01/30/2015 Primary Physician: Madelyn Brunner, MD Primary Cardiologist: None  Chief Complaint:  Chief Complaint  Patient presents with  . Fall  . Shortness of Breath   Reason for Consult: elevated troponin with chest pain  HPI: 81 y.o. female with known previous abnormal EKG and essential hypertension with acute onset of substernal chest discomfort radiating into the left chest and taken to the back with an abnormal EKG consistent is normal sinus rhythm with bifascicular block. The patient had a CAT scan suggesting a pulmonary embolism and was placed on appropriate medication management. The patient has had 2 troponin of 1.1 most consistent with acute subendocardial myocardial infarction R non-ST elevation myocardial infarction consistent with likely hypoxia and right ventricular strain no evidence of acute coronary syndrome. The patient is comfortable this morning and has no other complaints  Past Medical History  Diagnosis Date  . DVT (deep venous thrombosis)   . Arthritis       Surgical History:  Past Surgical History  Procedure Laterality Date  . Breast surgery    . Abdominal hysterectomy    . Cholecystectomy    . Gum graft       Home Meds: Prior to Admission medications   Medication Sig Start Date End Date Taking? Authorizing Provider  aspirin EC 325 MG tablet Take 325 mg by mouth every morning.   Yes Historical Provider, MD  Aspirin-Salicylamide-Caffeine (ARTHRITIS STRENGTH BC POWDER PO) Take 1 packet by mouth daily as needed. For pain.   Yes Historical Provider, MD    Inpatient Medications:  . aspirin EC  325 mg Oral [id number]  . cefTRIAXone (ROCEPHIN)  IV  1 g Intravenous Q24H  . docusate sodium  100 mg Oral BID  . ipratropium-albuterol  3 mL Nebulization QID  . pantoprazole (PROTONIX) IV  40 mg Intravenous  Q12H  . sodium chloride  3 mL Intravenous Q12H   . sodium chloride 50 mL/hr at 01/29/15 1900  . heparin 1,100 Units/hr (01/29/15 1900)    Allergies: No Known Allergies  History   Social History  . Marital Status: Married    Spouse Name: N/A  . Number of Children: N/A  . Years of Education: N/A   Occupational History  . Not on file.   Social History Main Topics  . Smoking status: Never Smoker   . Smokeless tobacco: Not on file  . Alcohol Use: No  . Drug Use: Not on file  . Sexual Activity: Not on file   Other Topics Concern  . Not on file   Social History Narrative  . No narrative on file     History reviewed. No pertinent family history.   Review of Systems Positive for chest pain Negative for: General:  chills, fever, night sweats or weight changes.  Cardiovascular: PND orthopnea syncope dizziness  Dermatological skin lesions rashes Respiratory: Cough congestion Urologic: Frequent urination urination at night and hematuria Abdominal: negative for nausea, vomiting, diarrhea, bright red blood per rectum, melena, or hematemesis Neurologic: negative for visual changes, and/or hearing changes  All other systems reviewed and are otherwise negative except as noted above.  Labs:  Recent Labs  01/29/15 1009 01/29/15 1535 01/29/15 2128 01/30/15 0400  CKTOTAL 51  --   --   --   TROPONINI 0.12* 1.18* 0.82* 0.45*   Lab Results  Component Value Date  WBC 9.4 01/30/2015   HGB 11.8* 01/30/2015   HCT 35.8 01/30/2015   MCV 85.2 01/30/2015   PLT 194 01/30/2015    Recent Labs Lab 01/30/15 0400  NA 140  K 3.7  CL 110  CO2 26  BUN 7  CREATININE 0.68  CALCIUM 8.1*  PROT 5.4*  BILITOT 0.6  ALKPHOS 52  ALT 13*  AST 23  GLUCOSE 150*   No results found for: CHOL, HDL, LDLCALC, TRIG No results found for: DDIMER  Radiology/Studies:  Ct Head Wo Contrast  01/29/2015   CLINICAL DATA:  81 year old female with dizziness and syncope this morning in the  bathroom. Slurred speech. Initial encounter.  EXAM: CT HEAD WITHOUT CONTRAST  TECHNIQUE: Contiguous axial images were obtained from the base of the skull through the vertex without intravenous contrast.  COMPARISON:  Head CT 04/01/2004 report (no images available).  FINDINGS: Chronic benign exostosis from the left lateral skull all (series 3, image 18) described also in 2005. Mild hyperostosis front talus. No calvarium fracture. No scalp hematoma. No acute orbits soft tissue finding identified.  Visualized paranasal sinuses and mastoids are clear. There is a small 10 mm area of polypoid mucosal thickening or small superior nasal polyp on the right seen on series 3, image 8. Leftward nasal septal deviation incidentally noted.  Calcified atherosclerosis at the skull base. Mild dystrophic basal ganglia calcifications. Cerebral volume and ventricle size is concordant with age. Mild mostly periventricular white matter nonspecific hypodensity. Small chronic appearing lacunar infarct at the posterior right caudate. No evidence of cortically based acute infarction identified. No suspicious intracranial vascular hyperdensity. No acute intracranial hemorrhage identified.  IMPRESSION: 1. No acute intracranial abnormality. No acute traumatic injury identified. 2. Mild for age chronic small vessel disease. 3. Small superior right nasal cavity mucosal thickening or polyp (series 3, image 8), likely inconsequential.   Electronically Signed   By: Genevie Ann M.D.   On: 01/29/2015 12:04   Ct Angio Chest Pe W/cm &/or Wo Cm  01/29/2015   CLINICAL DATA:  Shortness of breath, chest discomfort  EXAM: CT ANGIOGRAPHY CHEST WITH CONTRAST  TECHNIQUE: Multidetector CT imaging of the chest was performed using the standard protocol during bolus administration of intravenous contrast. Multiplanar CT image reconstructions and MIPs were obtained to evaluate the vascular anatomy.  CONTRAST:  80mL OMNIPAQUE IOHEXOL 350 MG/ML SOLN  COMPARISON:  None.   FINDINGS: There is adequate opacification of the pulmonary arteries. There is extensive bilateral pulmonary embolus. There are pulmonary emboli in the right and left main pulmonary arteries. There is near occlusive pulmonary embolus in the left upper lobe pulmonary artery segment. There are pulmonary emboli in the right lower lobe and left lower lobe lobar and segmental branches. The RV/ lb ratio is 1.17. There is leftward bowing of the interventricular septum. The appearance is most consistent with right heart strain.  The main pulmonary artery, right main pulmonary artery and left main pulmonary arteries are normal in size. The heart size is enlarged. There is no pericardial effusion.  There is a calcified right middle lobe pulmonary nodule consistent with sequela prior granulomatous disease. There is subpleural bilateral interstitial thickening with patchy areas of ground-glass opacity likely reflecting mild vascular congestion. There are no pleural effusions. There is no pneumothorax. There is a 4 mm lingular pulmonary nodule.  There is no axillary, hilar, or mediastinal adenopathy.  There is no lytic or blastic osseous lesion.  The visualized portions of the upper abdomen are unremarkable.  Review of  the MIP images confirms the above findings.  IMPRESSION: 1. Extensive bilateral pulmonary emboli. Positive for acute PE with CT evidence of right heart strain (RV/LV Ratio = 1.17) consistent with at least submassive (intermediate risk) PE. The presence of right heart strain has been associated with an increased risk of morbidity and mortality. Critical Value/emergent results were called by telephone at the time of interpretation on 01/29/2015 at 12:33 pm to Dr. Marjean Donna , who verbally acknowledged these results. 2. Bilateral mild interstitial thickening and patchy areas of ground-glass opacity as can be seen with mild pulmonary edema.   Electronically Signed   By: Kathreen Devoid   On: 01/29/2015 12:36   US  Venous Img Lower Bilateral  01/29/2015   CLINICAL DATA:  Pulmonary embolism, bilateral and extensive. Bilateral lower extremity swelling. Symptoms for 3 weeks.  EXAM: BILATERAL LOWER EXTREMITY VENOUS DOPPLER ULTRASOUND  TECHNIQUE: Gray-scale sonography with graded compression, as well as color Doppler and duplex ultrasound were performed to evaluate the lower extremity deep venous systems from the level of the common femoral vein and including the common femoral, femoral, profunda femoral, popliteal and calf veins including the posterior tibial, peroneal and gastrocnemius veins when visible. The superficial great saphenous vein was also interrogated. Spectral Doppler was utilized to evaluate flow at rest and with distal augmentation maneuvers in the common femoral, femoral and popliteal veins.  COMPARISON:  None.  FINDINGS: RIGHT LOWER EXTREMITY  Stools  Common Femoral Vein: No evidence of thrombus. Normal compressibility, respiratory phasicity and response to augmentation.  Saphenofemoral Junction: No evidence of thrombus. Normal compressibility and flow on color Doppler imaging.  Profunda Femoral Vein: No evidence of thrombus. Normal compressibility and flow on color Doppler imaging.  Femoral Vein: No evidence of thrombus. Normal compressibility, respiratory phasicity and response to augmentation.  Popliteal Vein: Nonocclusive thrombus is identified within the right proximal popliteal vein. Vein is not fully compressible but shows normal vague density.  Calf Veins: No evidence of thrombus. Normal compressibility and flow on color Doppler imaging.  Superficial Great Saphenous Vein: No evidence of thrombus. Normal compressibility and flow on color Doppler imaging.  Venous Reflux:  None.  Other Findings:  None.  LEFT LOWER EXTREMITY  Common Femoral Vein: No evidence of thrombus. Normal compressibility, respiratory phasicity and response to augmentation.  Saphenofemoral Junction: No evidence of thrombus. Normal  compressibility and flow on color Doppler imaging.  Profunda Femoral Vein: No evidence of thrombus. Normal compressibility and flow on color Doppler imaging.  Femoral Vein: Nonocclusive thrombus is identified within the proximal left femoral vein. Vein is not fully compressible but shows normal phase is the.  Popliteal Vein: No evidence of thrombus. Normal compressibility, respiratory phasicity and response to augmentation.  Calf Veins: Peroneal vein is not well seen. The posterior tibial vein is normal in appearance.  Superficial Great Saphenous Vein: No evidence of thrombus. Normal compressibility and flow on color Doppler imaging.  Venous Reflux:  None.  Other Findings:  None.  IMPRESSION: 1. Nonocclusive thrombus within the right popliteal vein 2. Nonocclusive thrombus within the left femoral vein.   Electronically Signed   By: Nolon Nations M.D.   On: 01/29/2015 16:44    EKG: Normal sinus rhythm with bifascicular block  Weights: Filed Weights   01/29/15 0950 01/30/15 0408  Weight: 153 lb (69.4 kg) 152 lb 3.2 oz (69.037 kg)     Physical Exam: Blood pressure 159/66, pulse 82, temperature 98.6 F (37 C), temperature source Oral, resp. rate 20, height 5\' 6"  (  1.676 m), weight 152 lb 3.2 oz (69.037 kg), SpO2 98 %. Body mass index is 24.58 kg/(m^2). General: Well developed, well nourished, in no acute distress. Head eyes ears nose throat: Normocephalic, atraumatic, sclera non-icteric, no xanthomas, nares are without discharge. No apparent thyromegaly and/or mass  Lungs: Normal respiratory effort.  no wheezes, no rales, no rhonchi.  Heart: RRR with normal S1 S2. no murmur gallop, no rub, PMI is normal size and placement, carotid upstroke normal without bruit, jugular venous pressure is normal Abdomen: Soft, non-tender, non-distended with normoactive bowel sounds. No hepatomegaly. No rebound/guarding. No obvious abdominal masses. Abdominal aorta is normal size without bruit Extremities: No edema.  no cyanosis, no clubbing, no ulcers  Peripheral : 2+ bilateral upper extremity pulses, 2+ bilateral femoral pulses, 2+ bilateral dorsal pedal pulse Neuro: Alert and oriented. No facial asymmetry. No focal deficit. Moves all extremities spontaneously. Musculoskeletal: Normal muscle tone without kyphosis Psych:  Responds to questions appropriately with a normal affect.    Assessment: 81 year old female with essential hypertension with acute pulmonary embolism with likely right heart strain and chest discomfort with abnormal EKG and non-ST elevation myocardial infarction due to current pulmonary embolism hypoxia rather than acute coronary syndrome  Plan: 1. Heparin for further risk reduction in pulmonary embolism 2. Oral anticoagulation for further risk reduction of pulmonary embolism if able 3. Follow for any hypertension control needed with medication management 4. Echocardiogram for LV systolic dysfunction and right heart strain 5. Further diagnostic testing and treatment options after above  Signed, Corey Skains M.D. [facility] 01/30/2015, 8:10 AM

## 2015-01-30 NOTE — Progress Notes (Signed)
Pt returned from [facility] s/p IVC filter placement in through rt groin.  PAD device in place.  Pt alert, oriented x3.  No distress on 3LO2 per [place].  Denies pain or discomfort at this time.  CB in reach, SR up x2, bed alarm on.

## 2015-01-30 NOTE — Progress Notes (Addendum)
[facility] NAME: Courtney Simon    MR#:  [id number]  DATE OF BIRTH:  09/01/1921  SUBJECTIVE:  CHIEF COMPLAINT:   Chief Complaint  Patient presents with  . Fall  . Shortness of Breath   SOB better. Still has cough. Family at bedside. REVIEW OF SYSTEMS:    Review of Systems  Constitutional: Negative for fever and chills.  HENT: Negative for sore throat.   Eyes: Negative for blurred vision, double vision and pain.  Respiratory: Positive for cough and shortness of breath. Negative for hemoptysis and wheezing.   Cardiovascular: Negative for chest pain, palpitations, orthopnea and leg swelling.  Gastrointestinal: Negative for heartburn, nausea, vomiting, abdominal pain, diarrhea and constipation.  Genitourinary: Negative for dysuria and hematuria.  Musculoskeletal: Positive for joint pain. Negative for back pain.  Skin: Negative for rash.  Neurological: Negative for sensory change, speech change, focal weakness and headaches.  Endo/Heme/Allergies: Does not bruise/bleed easily.  Psychiatric/Behavioral: Positive for memory loss. Negative for depression. The patient is not nervous/anxious.       DRUG ALLERGIES:  No Known Allergies  VITALS:  Blood pressure 157/76, pulse 86, temperature 98.2 F (36.8 C), temperature source Oral, resp. rate 23, height 5\' 6"  (1.676 m), weight 69.037 kg (152 lb 3.2 oz), SpO2 87 %.  PHYSICAL EXAMINATION:   Physical Exam  GENERAL:  81 y.o.-year-old patient lying in the bed with resp distress. EYES: Pupils equal, round, reactive to light and accommodation. No scleral icterus. Extraocular muscles intact.  HEENT: Head atraumatic, normocephalic. Oropharynx and nasopharynx clear.  NECK:  Supple, no jugular venous distention. No thyroid enlargement, no tenderness.  LUNGS: Normal breath sounds bilaterally, no wheezing, rales, rhonchi. No use of accessory muscles of respiration.  CARDIOVASCULAR: S1,  S2 normal. No murmurs, rubs, or gallops.  ABDOMEN: Soft, nontender, nondistended. Bowel sounds present. No organomegaly or mass.  EXTREMITIES: No cyanosis, clubbing or edema b/l.    NEUROLOGIC: Cranial nerves II through XII are intact. No focal Motor or sensory deficits b/l.   PSYCHIATRIC: The patient is alert and awake. SKIN: No obvious rash, lesion, or ulcer.    LABORATORY PANEL:   CBC  Recent Labs Lab 01/30/15 0400  WBC 9.4  HGB 11.8*  HCT 35.8  PLT 194   ------------------------------------------------------------------------------------------------------------------  Chemistries   Recent Labs Lab 01/30/15 0400  NA 140  K 3.7  CL 110  CO2 26  GLUCOSE 150*  BUN 7  CREATININE 0.68  CALCIUM 8.1*  AST 23  ALT 13*  ALKPHOS 52  BILITOT 0.6   ------------------------------------------------------------------------------------------------------------------  Cardiac Enzymes  Recent Labs Lab 01/30/15 0400  TROPONINI 0.45*   ------------------------------------------------------------------------------------------------------------------  RADIOLOGY:  Ct Head Wo Contrast  01/29/2015   CLINICAL DATA:  81 year old female with dizziness and syncope this morning in the bathroom. Slurred speech. Initial encounter.  EXAM: CT HEAD WITHOUT CONTRAST  TECHNIQUE: Contiguous axial images were obtained from the base of the skull through the vertex without intravenous contrast.  COMPARISON:  Head CT 04/01/2004 report (no images available).  FINDINGS: Chronic benign exostosis from the left lateral skull all (series 3, image 18) described also in 2005. Mild hyperostosis front talus. No calvarium fracture. No scalp hematoma. No acute orbits soft tissue finding identified.  Visualized paranasal sinuses and mastoids are clear. There is a small 10 mm area of polypoid mucosal thickening or small superior nasal polyp on the right seen on series 3, image 8. Leftward nasal septal deviation  incidentally noted.  Calcified atherosclerosis at the skull base. Mild dystrophic basal ganglia calcifications. Cerebral volume and ventricle size is concordant with age. Mild mostly periventricular white matter nonspecific hypodensity. Small chronic appearing lacunar infarct at the posterior right caudate. No evidence of cortically based acute infarction identified. No suspicious intracranial vascular hyperdensity. No acute intracranial hemorrhage identified.  IMPRESSION: 1. No acute intracranial abnormality. No acute traumatic injury identified. 2. Mild for age chronic small vessel disease. 3. Small superior right nasal cavity mucosal thickening or polyp (series 3, image 8), likely inconsequential.   Electronically Signed   By: Genevie Ann M.D.   On: 01/29/2015 12:04   Ct Angio Chest Pe W/cm &/or Wo Cm  01/29/2015   CLINICAL DATA:  Shortness of breath, chest discomfort  EXAM: CT ANGIOGRAPHY CHEST WITH CONTRAST  TECHNIQUE: Multidetector CT imaging of the chest was performed using the standard protocol during bolus administration of intravenous contrast. Multiplanar CT image reconstructions and MIPs were obtained to evaluate the vascular anatomy.  CONTRAST:  45mL OMNIPAQUE IOHEXOL 350 MG/ML SOLN  COMPARISON:  None.  FINDINGS: There is adequate opacification of the pulmonary arteries. There is extensive bilateral pulmonary embolus. There are pulmonary emboli in the right and left main pulmonary arteries. There is near occlusive pulmonary embolus in the left upper lobe pulmonary artery segment. There are pulmonary emboli in the right lower lobe and left lower lobe lobar and segmental branches. The RV/ lb ratio is 1.17. There is leftward bowing of the interventricular septum. The appearance is most consistent with right heart strain.  The main pulmonary artery, right main pulmonary artery and left main pulmonary arteries are normal in size. The heart size is enlarged. There is no pericardial effusion.  There is a  calcified right middle lobe pulmonary nodule consistent with sequela prior granulomatous disease. There is subpleural bilateral interstitial thickening with patchy areas of ground-glass opacity likely reflecting mild vascular congestion. There are no pleural effusions. There is no pneumothorax. There is a 4 mm lingular pulmonary nodule.  There is no axillary, hilar, or mediastinal adenopathy.  There is no lytic or blastic osseous lesion.  The visualized portions of the upper abdomen are unremarkable.  Review of the MIP images confirms the above findings.  IMPRESSION: 1. Extensive bilateral pulmonary emboli. Positive for acute PE with CT evidence of right heart strain (RV/LV Ratio = 1.17) consistent with at least submassive (intermediate risk) PE. The presence of right heart strain has been associated with an increased risk of morbidity and mortality. Critical Value/emergent results were called by telephone at the time of interpretation on 01/29/2015 at 12:33 pm to Dr. Marjean Donna , who verbally acknowledged these results. 2. Bilateral mild interstitial thickening and patchy areas of ground-glass opacity as can be seen with mild pulmonary edema.   Electronically Signed   By: Kathreen Devoid   On: 01/29/2015 12:36   US Venous Img Lower Bilateral  01/29/2015   CLINICAL DATA:  Pulmonary embolism, bilateral and extensive. Bilateral lower extremity swelling. Symptoms for 3 weeks.  EXAM: BILATERAL LOWER EXTREMITY VENOUS DOPPLER ULTRASOUND  TECHNIQUE: Gray-scale sonography with graded compression, as well as color Doppler and duplex ultrasound were performed to evaluate the lower extremity deep venous systems from the level of the common femoral vein and including the common femoral, femoral, profunda femoral, popliteal and calf veins including the posterior tibial, peroneal and gastrocnemius veins when visible. The superficial great saphenous vein was also interrogated. Spectral Doppler was utilized to evaluate flow at  rest and with distal  augmentation maneuvers in the common femoral, femoral and popliteal veins.  COMPARISON:  None.  FINDINGS: RIGHT LOWER EXTREMITY  Stools  Common Femoral Vein: No evidence of thrombus. Normal compressibility, respiratory phasicity and response to augmentation.  Saphenofemoral Junction: No evidence of thrombus. Normal compressibility and flow on color Doppler imaging.  Profunda Femoral Vein: No evidence of thrombus. Normal compressibility and flow on color Doppler imaging.  Femoral Vein: No evidence of thrombus. Normal compressibility, respiratory phasicity and response to augmentation.  Popliteal Vein: Nonocclusive thrombus is identified within the right proximal popliteal vein. Vein is not fully compressible but shows normal vague density.  Calf Veins: No evidence of thrombus. Normal compressibility and flow on color Doppler imaging.  Superficial Great Saphenous Vein: No evidence of thrombus. Normal compressibility and flow on color Doppler imaging.  Venous Reflux:  None.  Other Findings:  None.  LEFT LOWER EXTREMITY  Common Femoral Vein: No evidence of thrombus. Normal compressibility, respiratory phasicity and response to augmentation.  Saphenofemoral Junction: No evidence of thrombus. Normal compressibility and flow on color Doppler imaging.  Profunda Femoral Vein: No evidence of thrombus. Normal compressibility and flow on color Doppler imaging.  Femoral Vein: Nonocclusive thrombus is identified within the proximal left femoral vein. Vein is not fully compressible but shows normal phase is the.  Popliteal Vein: No evidence of thrombus. Normal compressibility, respiratory phasicity and response to augmentation.  Calf Veins: Peroneal vein is not well seen. The posterior tibial vein is normal in appearance.  Superficial Great Saphenous Vein: No evidence of thrombus. Normal compressibility and flow on color Doppler imaging.  Venous Reflux:  None.  Other Findings:  None.  IMPRESSION: 1.  Nonocclusive thrombus within the right popliteal vein 2. Nonocclusive thrombus within the left femoral vein.   Electronically Signed   By: Nolon Nations M.D.   On: 01/29/2015 16:44     ASSESSMENT AND PLAN:   * Acute bilateral PE with right heart strain On heparin drip. Echocardiogram pending. IVC filter placed by Dr. Lucky Cowboy. Patient is high risk for deterioration and further complications. Not a candidate for TPA. Transition to oral anticoagulation status at discharge.  * Acute hypoxic respiratory failure Due to bilateral pulmonary embolism. Wean oxygen as tolerated.  * Hypertension Continue home medications  * Elevated troponin Due to demand ischemia. His is not MI. [facility] input. Waiting on echocardiogram results.   All the records are reviewed and case discussed with Care Management/Social Workerr. Management plans discussed with the patient, family and they are in agreement.  CODE STATUS: DNR/DNI  DVT Prophylaxis: SCDs  TOTAL TIME TAKING CARE OF THIS PATIENT: 35 minutes.   POSSIBLE D/C IN 1 DAYS, DEPENDING ON CLINICAL CONDITION.  Discussed with husband and daughter at bedside regarding patient having high risk for complications considering her large bilateral pulmonary embolisms and advanced age.  Hillary Bow R M.D on 01/30/2015 at 12:54 PM  Between 7am to 6pm - Pager - [phone]  After 6pm go to [email or web address] - password [email or web address] [facility]  Office  [phone]  CC: Primary care physician; Madelyn Brunner, MD

## 2015-01-30 NOTE — Op Note (Signed)
[facility]   OPERATIVE NOTE    PRE-OPERATIVE DIAGNOSIS: submassive PE and residual LE DVT  POST-OPERATIVE DIAGNOSIS: same  PROCEDURE: 1.   Ultrasound guidance for vascular access to the right vein 2.   Catheter placement into the inferior vena cava 3.   Inferior venacavogram 4.   Placement of a Cook Celect IVC filter  SURGEON: Leotis Pain, MD  ASSISTANT(S): None  ANESTHESIA: local/sedation  ESTIMATED BLOOD LOSS: minimal  FINDING(S): 1.  Patent IVC  SPECIMEN(S):  none  INDICATIONS:   Courtney Simon is a 81 y.o. female who presents with shortness of breath and is found to have submassive PE with right heart strain and residual LE DVT.  Any further PE could be fatal, and she is not felt to be a great candidate for long term anticoagulation.  Inferior vena cava filter is indicated for this reason.  Risks and benefits including filter thrombosis, migration, fracture, bleeding, and infection were all discussed.  We discussed that all IVC filters that we place can be removed if desired from the patient once the need for the filter has passed.    DESCRIPTION: After obtaining full informed written consent, the patient was brought back to the vascular suite. The skin was sterilely prepped and draped in a sterile surgical field was created. The right femoral vein was accessed under direct ultrasound guidance without difficulty with a Seldinger needle and a J-wire was then placed. A permanent image was recorded.  After skin nick and dilatation, the delivery sheath was placed into the inferior vena cava and an inferior venacavogram was performed. This demonstrated a patent IVC with the level of the renal veins at L1.  The filter was then deployed into the inferior vena cava at the level of the L1-L2 interspace just below the renal veins. The delivery sheath was then removed. Pressure was held. Sterile dressings were placed. The patient tolerated the procedure well and was  taken to the recovery room in stable condition.  COMPLICATIONS: None  CONDITION: Stable  Courtney Simon  01/30/2015, 12:27 PM

## 2015-01-30 NOTE — Progress Notes (Signed)
Dr. Lucky Cowboy called re PAD device and orders for care.  Orders received.

## 2015-01-30 NOTE — Progress Notes (Signed)
To specials via bed 

## 2015-01-30 NOTE — Progress Notes (Signed)
Dressing to rt groin remains dry and intact.  No bleeding noted.

## 2015-01-30 NOTE — Progress Notes (Signed)
PAD removed from rt groin, no bleeding noted.  Dry dressing with tegaderm covering.  Will monitor.

## 2015-01-31 LAB — CBC
HCT: 34.1 % — ABNORMAL LOW (ref 35.0–47.0)
Hemoglobin: 11.2 g/dL — ABNORMAL LOW (ref 12.0–16.0)
MCH: 27.9 pg (ref 26.0–34.0)
MCHC: 32.9 g/dL (ref 32.0–36.0)
MCV: 84.8 fL (ref 80.0–100.0)
PLATELETS: 185 10*3/uL (ref 150–440)
RBC: 4.02 MIL/uL (ref 3.80–5.20)
RDW: 14.7 % — ABNORMAL HIGH (ref 11.5–14.5)
WBC: 7.7 10*3/uL (ref 3.6–11.0)

## 2015-01-31 LAB — HEPARIN LEVEL (UNFRACTIONATED): HEPARIN UNFRACTIONATED: 0.62 [IU]/mL (ref 0.30–0.70)

## 2015-01-31 MED ORDER — PANTOPRAZOLE SODIUM 40 MG PO TBEC
40.0000 mg | DELAYED_RELEASE_TABLET | Freq: Every day | ORAL | Status: DC
Start: 2015-01-31 — End: 2020-01-01

## 2015-01-31 MED ORDER — FUROSEMIDE 10 MG/ML IJ SOLN
40.0000 mg | Freq: Once | INTRAMUSCULAR | Status: AC
  Administered 2015-01-31: 40 mg via INTRAVENOUS
  Filled 2015-01-31: qty 4

## 2015-01-31 MED ORDER — RIVAROXABAN 15 MG PO TABS
15.0000 mg | ORAL_TABLET | Freq: Two times a day (BID) | ORAL | Status: DC
  Administered 2015-01-31: 15 mg via ORAL
  Filled 2015-01-31: qty 1

## 2015-01-31 MED ORDER — RIVAROXABAN 15 MG PO TABS: 15.0000 mg | ORAL_TABLET | Freq: Two times a day (BID) | ORAL | Status: DC

## 2015-01-31 MED ORDER — IPRATROPIUM-ALBUTEROL 0.5-2.5 (3) MG/3ML IN SOLN: 3.0000 mL | Freq: Four times a day (QID) | RESPIRATORY_TRACT | Status: DC | PRN

## 2015-01-31 NOTE — Progress Notes (Signed)
SATURATION QUALIFICATIONS: (This note is used to comply with regulatory documentation for home oxygen)  Patient Saturations on Room Air at Rest = 94%  Patient Saturations on Room Air while Ambulating = 90%  Patient Saturations on N/A Liters of oxygen while Ambulating = N/A  Please briefly explain why patient needs home oxygen: After ambulation, recovered to 93% on RA. Patient on 3L prior to O2 study.  Wilnette Kales

## 2015-01-31 NOTE — Care Management (Signed)
Patient for discharge home today and is agreeable to home health nursing.  No agency preference.  Referral To [facility].  Patient qualifies for home 02.  [facility] referral to [facility].  Provided patient with 30 day coupon for one month's free Xarelto.  Home health agency will make initial visit 7/20.

## 2015-01-31 NOTE — Progress Notes (Signed)
ANTICOAGULATION CONSULT NOTE - Follow Up Consult  Pharmacy Consult for Heparin Indication: pulmonary embolus  No Known Allergies  Patient Measurements: Height: 5\' 6"  (167.6 cm) Weight: 152 lb 3.2 oz (69.037 kg) IBW/kg (Calculated) : 59.3  Vital Signs: Temp: 98.3 F (36.8 C) (07/19 0019) Temp Source: Oral (07/19 0019) BP: 159/75 mmHg (07/18 1925) Pulse Rate: 80 (07/18 1925)  Labs:  Recent Labs  01/29/15 1009 01/29/15 1535  01/29/15 2128 01/30/15 0400 01/30/15 0908 01/30/15 1753 01/31/15 0159  HGB 12.7  --   --   --  11.8*  --   --  11.2*  HCT 39.7  --   --   --  35.8  --   --  34.1*  PLT 208  --   --   --  194  --   --  185  APTT 24  --   --   --   --   --   --   --   LABPROT 13.9  --   --   --   --   --   --   --   INR 1.05  --   --   --   --   --   --   --   HEPARINUNFRC  --   --   < > 0.77*  --  0.98* 0.57 0.62  CREATININE 0.74  --   --   --  0.68  --   --   --   CKTOTAL 51  --   --   --   --   --   --   --   TROPONINI 0.12* 1.18*  --  0.82* 0.45*  --   --   --   < > = values in this interval not displayed.  Estimated Creatinine Clearance: 42.9 mL/min (by C-G formula based on Cr of 0.68).   Medications:  Scheduled:  . aspirin EC  325 mg Oral [id number]  . cefTRIAXone (ROCEPHIN) IVPB 1 gram/50 mL D5W  1 g Intravenous Q24H  . docusate sodium  100 mg Oral BID  . ipratropium-albuterol  3 mL Nebulization QID  . pantoprazole (PROTONIX) IV  40 mg Intravenous Q12H  . sodium chloride  3 mL Intravenous Q12H   Infusions:  . heparin 950 Units/hr (01/30/15 1339)   PRN: acetaminophen **OR** acetaminophen, bisacodyl, guaiFENesin-dextromethorphan, morphine injection, ondansetron **OR** ondansetron (ZOFRAN) IV  Assessment: 81 y/o F on heparin drip for PE with HL at goal.   Goal of Therapy:  Heparin level 0.3-0.7 units/ml Monitor platelets by anticoagulation protocol: Yes   Plan:  Will continue heparin drip at 950 units/hr and recheck a HL in 8 h.   7/19:  HL @ 2:00 =  0.62 Will recheck HL on 7/20 with AM labs.  Yahsir Wickens D 01/31/2015,2:53 AM

## 2015-01-31 NOTE — Care Management Important Message (Signed)
Important Message  Patient Details  Name: Courtney Simon MRN: [id number] Date of Birth: 01/02/1922   Medicare Important Message Given:  Yes-second notification given    [name] 01/31/2015, 9:55 AM

## 2015-01-31 NOTE — Progress Notes (Signed)
SATURATION QUALIFICATIONS:   Late entry.  This information was verbally reported to CM approximately 13:00   Patient Saturations on Room Air at Rest = 87% Patient saturation at rest on 3 liters = 92%

## 2015-01-31 NOTE — Evaluation (Signed)
Physical Therapy Evaluation Patient Details Name: Courtney Simon MRN: [id number] DOB: 03-Mar-1922 Today's Date: 01/31/2015   History of Present Illness  Pt is a 81 y.o. female admitted with acute B PE with R heart strain.  Pt s/p IVC filter 01/30/15.  Clinical Impression  Currently pt demonstrates impairments with activity tolerance, balance, strength, and limitations with functional mobility.  Prior to admission, pt was independent without AD.  Pt lives with her husband in 1 level home with 3-4 STE with B railing (pt's daughter lives next door).  Currently pt is mod assist supine to sit; CGA to SBA with transfers and ambulation with RW; ambulation distance limited to 30 feet d/t SOB (HR also noted to be elevated and tachy 110-126 bpm post ambulation; nursing present end of ambulation and notified).  Pt would benefit from skilled PT to address above noted impairments and functional limitations.  Recommend pt discharge to STR but pt and pt's family requesting pt discharge home.  D/t this, recommend pt have 24/7 physical assist with all functional mobility with use of RW and also use of w/c for longer distances in home (pt's husband and daughter report they can provide 24/7 physical assist for pt and they already have a family RW for pt to use at home--pt's family educated on how to properly fit RW to pt; pt's family also reports they can borrow a w/c from the church for longer distances in home and in community).  Pt already with discharge order in system and plan to discharge home today.  D/t pt and pt's family requesting discharge home, recommend HHPT (care management notified).     Follow Up Recommendations Supervision/Assistance - 24 hour    Equipment Recommendations  Rolling walker with 5" wheels;Wheelchair (measurements PT)    Recommendations for Other Services       Precautions / Restrictions Precautions Precautions: Fall Restrictions Weight Bearing Restrictions: No      Mobility  Bed  Mobility Overal bed mobility: Needs Assistance Bed Mobility: Supine to Sit     Supine to sit: Mod assist     General bed mobility comments: pt's daughter assisted pt (via assisting pt up via her trunk) supine to sit  Transfers Overall transfer level: Needs assistance Equipment used: Rolling walker (2 wheeled) Transfers: Sit to/from Stand Sit to Stand: Supervision;Min guard         General transfer comment: steady; no loss of balance  Ambulation/Gait Ambulation/Gait assistance: Supervision;Min guard Ambulation Distance (Feet): 30 Feet Assistive device: Rolling walker (2 wheeled)   Gait velocity: decreased   General Gait Details: decreased B step length, decreased B foot clearance/heelstrike; limited distance d/t SOB; pt requiring occasional vc's for safe walker use  Stairs            Wheelchair Mobility    Modified Rankin (Stroke Patients Only)       Balance Overall balance assessment: Needs assistance Sitting-balance support: Bilateral upper extremity supported;Feet supported Sitting balance-Leahy Scale: Good     Standing balance support: Bilateral upper extremity supported Standing balance-Leahy Scale: Good                               Pertinent Vitals/Pain Pain Assessment: No/denies pain  See flowsheets (1535 vitals) for details.    Home Living Family/patient expects to be discharged to:: Private residence Living Arrangements: Spouse/significant other Available Help at Discharge: Family (Pt's daughter lives next door) Type of Home: House Home Access: Stairs  to enter   Entrance Stairs-Number of Steps: 3-4 with B railing Home Layout: One level Home Equipment: [place] - 4 wheels;Cane - single point      Prior Function Level of Independence: Independent               Hand Dominance        Extremity/Trunk Assessment   Upper Extremity Assessment: Generalized weakness           Lower Extremity Assessment: Generalized  weakness         Communication   Communication: HOH  Cognition Arousal/Alertness: Awake/alert Behavior During Therapy: WFL for tasks assessed/performed Overall Cognitive Status: Within Functional Limits for tasks assessed                      General Comments   Nursing cleared pt for participation in physical therapy.  Pt agreeable to PT session.  Pt's husband and daughter present and verbalizing they want to have PT prior to pt leaving.    Exercises  Treatment:  Plan for pt to discharge home with O2 (per notes, pt already qualified for O2 and pt with O2 system for home in room already); pt and pt's family educated on use of O2; safety with O2 with mobility; and monitoring of symptom's with O2 use.  Also educated pt and pt's family on pacing/energy conservation techniques.  Pt's family verbalizing good understanding of above and also assist required for safe functional mobility for discharge.      Assessment/Plan    PT Assessment Patient needs continued PT services  PT Diagnosis Difficulty walking;Generalized weakness   PT Problem List Decreased strength;Decreased activity tolerance;Decreased balance;Decreased mobility;Decreased knowledge of use of DME  PT Treatment Interventions DME instruction;Gait training;Stair training;Functional mobility training;Therapeutic activities;Therapeutic exercise;Balance training;Patient/family education   PT Goals (Current goals can be found in the Care Plan section) Acute Rehab PT Goals Patient Stated Goal: To go home PT Goal Formulation: With patient/family Time For Goal Achievement: March 02, 2015 Potential to Achieve Goals: Good    Frequency Min 2X/week   Barriers to discharge        Co-evaluation               End of Session Equipment Utilized During Treatment: Gait belt;Oxygen Activity Tolerance: Patient limited by fatigue Patient left: in bed;with call bell/phone within reach;with nursing/sitter in room (pt sitting on edge  of bed with nursing present taking pt's IV out) Nurse Communication: Mobility status (pt's HR and O2 with activity)         Time: 2703-5009 PT Time Calculation (min) (ACUTE ONLY): 33 min   Charges:   PT Evaluation $Initial PT Evaluation Tier I: 1 Procedure PT Treatments $Therapeutic Exercise: 8-22 mins   PT G CodesLeitha Bleak 03-02-15, 4:36 PM Leitha Bleak, PT [phone]  Addendum:  No POC initiated d/t pt discharged home today already.

## 2015-01-31 NOTE — Progress Notes (Signed)
[facility] Encounter Note  Patient: Courtney Simon / Admit Date: 01/29/2015 / Date of Encounter: 01/31/2015, 8:33 AM   Subjective: Shortness of breath and weakness today but no evidence of chest discomfort or syncope  Review of Systems: Positive for: Shortness of breath Negative for: Vision change, hearing change, syncope, dizziness, nausea, vomiting,diarrhea, bloody stool, stomach pain, cough, congestion, diaphoresis, urinary frequency, urinary pain,skin lesions, skin rashes Others previously listed  Objective: Telemetry: Normal sinus rhythm Physical Exam: Blood pressure 152/59, pulse 88, temperature 98 F (36.7 C), temperature source Oral, resp. rate 20, height 5\' 6"  (1.676 m), weight 158 lb 12.8 oz (72.031 kg), SpO2 97 %. Body mass index is 25.64 kg/(m^2). General: Well developed, well nourished, in no acute distress. Head: Normocephalic, atraumatic, sclera non-icteric, no xanthomas, nares are without discharge. Neck: No apparent masses Lungs: Normal respirations with diffuse wheezes, no rhonchi, no rales , no crackles   Heart: Regular rate and rhythm, normal S1 S2, no murmur, no rub, no gallop, PMI is normal size and placement, carotid upstroke normal without bruit, jugular venous pressure normal Abdomen: Soft, non-tender, non-distended with normoactive bowel sounds. No hepatosplenomegaly. Abdominal aorta is normal size without bruit Extremities: Trace edema, no clubbing, no cyanosis, no ulcers,  Peripheral: 2+ radial, 2+ femoral, 2+ dorsal pedal pulses Neuro: Alert and oriented. Moves all extremities spontaneously. Psych:  Responds to questions appropriately with a normal affect.   Intake/Output Summary (Last 24 hours) at 01/31/15 0833 Last data filed at 01/31/15 0646  Gross per 24 hour  Intake   1283 ml  Output    760 ml  Net    523 ml    Inpatient Medications:  . aspirin EC  325 mg Oral [id number]  . cefTRIAXone (ROCEPHIN) IVPB 1 gram/50 mL D5W  1 g  Intravenous Q24H  . docusate sodium  100 mg Oral BID  . ipratropium-albuterol  3 mL Nebulization QID  . pantoprazole (PROTONIX) IV  40 mg Intravenous Q12H  . sodium chloride  3 mL Intravenous Q12H   Infusions:  . heparin 950 Units/hr (01/30/15 1339)    Labs:  Recent Labs  01/29/15 1009 01/30/15 0400  NA 139 140  K 3.8 3.7  CL 103 110  CO2 25 26  GLUCOSE 314* 150*  BUN 14 7  CREATININE 0.74 0.68  CALCIUM 8.9 8.1*    Recent Labs  01/29/15 1009 01/30/15 0400  AST 25 23  ALT 15 13*  ALKPHOS 64 52  BILITOT 0.6 0.6  PROT 6.6 5.4*  ALBUMIN 3.7 3.0*    Recent Labs  01/30/15 0400 01/31/15 0159  WBC 9.4 7.7  HGB 11.8* 11.2*  HCT 35.8 34.1*  MCV 85.2 84.8  PLT 194 185    Recent Labs  01/29/15 1009 01/29/15 1535 01/29/15 2128 01/30/15 0400  CKTOTAL 51  --   --   --   TROPONINI 0.12* 1.18* 0.82* 0.45*   Invalid input(s): POCBNP No results for input(s): HGBA1C in the last 72 hours.   Weights: Filed Weights   01/29/15 0950 01/30/15 0408 01/31/15 0500  Weight: 153 lb (69.4 kg) 152 lb 3.2 oz (69.037 kg) 158 lb 12.8 oz (72.031 kg)     Radiology/Studies:  Ct Head Wo Contrast  01/29/2015   CLINICAL DATA:  82 year old female with dizziness and syncope this morning in the bathroom. Slurred speech. Initial encounter.  EXAM: CT HEAD WITHOUT CONTRAST  TECHNIQUE: Contiguous axial images were obtained from the base of the skull through the vertex without intravenous  contrast.  COMPARISON:  Head CT 04/01/2004 report (no images available).  FINDINGS: Chronic benign exostosis from the left lateral skull all (series 3, image 18) described also in 2005. Mild hyperostosis front talus. No calvarium fracture. No scalp hematoma. No acute orbits soft tissue finding identified.  Visualized paranasal sinuses and mastoids are clear. There is a small 10 mm area of polypoid mucosal thickening or small superior nasal polyp on the right seen on series 3, image 8. Leftward nasal septal  deviation incidentally noted.  Calcified atherosclerosis at the skull base. Mild dystrophic basal ganglia calcifications. Cerebral volume and ventricle size is concordant with age. Mild mostly periventricular white matter nonspecific hypodensity. Small chronic appearing lacunar infarct at the posterior right caudate. No evidence of cortically based acute infarction identified. No suspicious intracranial vascular hyperdensity. No acute intracranial hemorrhage identified.  IMPRESSION: 1. No acute intracranial abnormality. No acute traumatic injury identified. 2. Mild for age chronic small vessel disease. 3. Small superior right nasal cavity mucosal thickening or polyp (series 3, image 8), likely inconsequential.   Electronically Signed   By: Genevie Ann M.D.   On: 01/29/2015 12:04   Ct Angio Chest Pe W/cm &/or Wo Cm  01/29/2015   CLINICAL DATA:  Shortness of breath, chest discomfort  EXAM: CT ANGIOGRAPHY CHEST WITH CONTRAST  TECHNIQUE: Multidetector CT imaging of the chest was performed using the standard protocol during bolus administration of intravenous contrast. Multiplanar CT image reconstructions and MIPs were obtained to evaluate the vascular anatomy.  CONTRAST:  86mL OMNIPAQUE IOHEXOL 350 MG/ML SOLN  COMPARISON:  None.  FINDINGS: There is adequate opacification of the pulmonary arteries. There is extensive bilateral pulmonary embolus. There are pulmonary emboli in the right and left main pulmonary arteries. There is near occlusive pulmonary embolus in the left upper lobe pulmonary artery segment. There are pulmonary emboli in the right lower lobe and left lower lobe lobar and segmental branches. The RV/ lb ratio is 1.17. There is leftward bowing of the interventricular septum. The appearance is most consistent with right heart strain.  The main pulmonary artery, right main pulmonary artery and left main pulmonary arteries are normal in size. The heart size is enlarged. There is no pericardial effusion.  There  is a calcified right middle lobe pulmonary nodule consistent with sequela prior granulomatous disease. There is subpleural bilateral interstitial thickening with patchy areas of ground-glass opacity likely reflecting mild vascular congestion. There are no pleural effusions. There is no pneumothorax. There is a 4 mm lingular pulmonary nodule.  There is no axillary, hilar, or mediastinal adenopathy.  There is no lytic or blastic osseous lesion.  The visualized portions of the upper abdomen are unremarkable.  Review of the MIP images confirms the above findings.  IMPRESSION: 1. Extensive bilateral pulmonary emboli. Positive for acute PE with CT evidence of right heart strain (RV/LV Ratio = 1.17) consistent with at least submassive (intermediate risk) PE. The presence of right heart strain has been associated with an increased risk of morbidity and mortality. Critical Value/emergent results were called by telephone at the time of interpretation on 01/29/2015 at 12:33 pm to Dr. Marjean Donna , who verbally acknowledged these results. 2. Bilateral mild interstitial thickening and patchy areas of ground-glass opacity as can be seen with mild pulmonary edema.   Electronically Signed   By: Kathreen Devoid   On: 01/29/2015 12:36   US Venous Img Lower Bilateral  01/29/2015   CLINICAL DATA:  Pulmonary embolism, bilateral and extensive. Bilateral lower extremity swelling.  Symptoms for 3 weeks.  EXAM: BILATERAL LOWER EXTREMITY VENOUS DOPPLER ULTRASOUND  TECHNIQUE: Gray-scale sonography with graded compression, as well as color Doppler and duplex ultrasound were performed to evaluate the lower extremity deep venous systems from the level of the common femoral vein and including the common femoral, femoral, profunda femoral, popliteal and calf veins including the posterior tibial, peroneal and gastrocnemius veins when visible. The superficial great saphenous vein was also interrogated. Spectral Doppler was utilized to evaluate flow  at rest and with distal augmentation maneuvers in the common femoral, femoral and popliteal veins.  COMPARISON:  None.  FINDINGS: RIGHT LOWER EXTREMITY  Stools  Common Femoral Vein: No evidence of thrombus. Normal compressibility, respiratory phasicity and response to augmentation.  Saphenofemoral Junction: No evidence of thrombus. Normal compressibility and flow on color Doppler imaging.  Profunda Femoral Vein: No evidence of thrombus. Normal compressibility and flow on color Doppler imaging.  Femoral Vein: No evidence of thrombus. Normal compressibility, respiratory phasicity and response to augmentation.  Popliteal Vein: Nonocclusive thrombus is identified within the right proximal popliteal vein. Vein is not fully compressible but shows normal vague density.  Calf Veins: No evidence of thrombus. Normal compressibility and flow on color Doppler imaging.  Superficial Great Saphenous Vein: No evidence of thrombus. Normal compressibility and flow on color Doppler imaging.  Venous Reflux:  None.  Other Findings:  None.  LEFT LOWER EXTREMITY  Common Femoral Vein: No evidence of thrombus. Normal compressibility, respiratory phasicity and response to augmentation.  Saphenofemoral Junction: No evidence of thrombus. Normal compressibility and flow on color Doppler imaging.  Profunda Femoral Vein: No evidence of thrombus. Normal compressibility and flow on color Doppler imaging.  Femoral Vein: Nonocclusive thrombus is identified within the proximal left femoral vein. Vein is not fully compressible but shows normal phase is the.  Popliteal Vein: No evidence of thrombus. Normal compressibility, respiratory phasicity and response to augmentation.  Calf Veins: Peroneal vein is not well seen. The posterior tibial vein is normal in appearance.  Superficial Great Saphenous Vein: No evidence of thrombus. Normal compressibility and flow on color Doppler imaging.  Venous Reflux:  None.  Other Findings:  None.  IMPRESSION: 1.  Nonocclusive thrombus within the right popliteal vein 2. Nonocclusive thrombus within the left femoral vein.   Electronically Signed   By: Nolon Nations M.D.   On: 01/29/2015 16:44     Assessment and Recommendation  81 y.o. female with acute bilateral pulmonary embolism likely from deep venous thrombosis with episode of syncope likely secondary to significant hypoxia and causing elevated troponin consistent with demand ischemia and hypoxia rather than acute coronary syndrome 1. Continue IVC filter and anticoagulation as able for further treatment of pulmonary embolism and hypoxia 2. Echocardiogram for evaluation of left ventricular and right ventricular strain pulmonary hypertension 3. Treatment of possible pneumonia bronchitis 4. No further intervention of elevated troponin likely secondary to pulmonary embolism 5. No further cardiac diagnostics necessary after above 6. call if further questions  Signed, Serafina Royals M.D. FACC

## 2015-01-31 NOTE — Care Management (Signed)
There had been a delay in the physical therapy consult due criteria relating anticoagulation.  Consult performed just prior to discharge and found that patient would benfit from home health physical therapy.  Added to home health referral.

## 2015-01-31 NOTE — Progress Notes (Signed)
Patient d/c'd home. Education provided, no questions at this time. Patient to be picked up by husband. Telemetry removed from patient. Courtney Simon

## 2015-01-31 NOTE — Discharge Instructions (Addendum)
°  DIET:  Cardiac diet  DISCHARGE CONDITION:  Fair  ACTIVITY:  Activity as tolerated  OXYGEN:  Home Oxygen: Yes.     Oxygen Delivery: 2 liters/min via Patient connected to nasal cannula oxygen  DISCHARGE LOCATION:  Home   If you experience worsening of your admission symptoms, develop shortness of breath, life threatening emergency, suicidal or homicidal thoughts you must seek medical attention immediately by calling 911 or calling your MD immediately  if symptoms less severe.  You Must read complete instructions/literature along with all the possible adverse reactions/side effects for all the Medicines you take and that have been prescribed to you. Take any new Medicines after you have completely understood and accpet all the possible adverse reactions/side effects.   Please note  You were cared for by a hospitalist during your hospital stay. If you have any questions about your discharge medications or the care you received while you were in the hospital after you are discharged, you can call the unit and asked to speak with the hospitalist on call if the hospitalist that took care of you is not available. Once you are discharged, your primary care physician will handle any further medical issues. Please note that NO REFILLS for any discharge medications will be authorized once you are discharged, as it is imperative that you return to your primary care physician (or establish a relationship with a primary care physician if you do not have one) for your aftercare needs so that they can reassess your need for medications and monitor your lab values.   You have blood thinner tablets for 21 days. You will need further prescription from your doctor when you see them in the office.  HAVE BEEN PROVIDED WITH A COUPON FOR ONE FREE MONTH OF BLOOD THINNER.  PRESENT THIS TO THE PHARMACIST WITH PRESCRIPTION  HOME HEALTH NURSE THROUGH [facility]- 1 [phone]- [facility] WILL CONTACT PATIENT AND IT IS  PLANNED FOR INITIAL VISIT 7/20  [facility] WILL PROVIDE THE 0XYGEN

## 2015-02-02 ENCOUNTER — Encounter: Payer: Self-pay | Admitting: Vascular Surgery

## 2015-02-02 DIAGNOSIS — Z95828 Presence of other vascular implants and grafts: Secondary | ICD-10-CM

## 2015-02-02 NOTE — Discharge Summary (Signed)
[facility] NAME: Courtney Simon    MR#:  [id number]  DATE OF BIRTH:  08/04/1921  DATE OF ADMISSION:  01/29/2015 ADMITTING PHYSICIAN: Idelle Crouch, MD  DATE OF DISCHARGE: 01/31/2015  4:00 PM  PRIMARY CARE PHYSICIAN: Madelyn Brunner, MD    ADMISSION DIAGNOSIS:  Pulmonary embolism [I26.99]  DISCHARGE DIAGNOSIS:  Principal Problem:   Pulmonary embolism Active Problems:   Acute respiratory failure   DVT (deep venous thrombosis)   SECONDARY DIAGNOSIS:   Past Medical History  Diagnosis Date  . DVT (deep venous thrombosis)   . Arthritis      ADMITTING HISTORY  Courtney Simon is a 81 y.o. female has a past medical history significant for OA and remote hx of DVT now with acute onset of SOB, CP and tachypnea found to have submassive PE on CT in ER with right heart strain. Has remote hx of DVT treated with Lovenox. Now admitted for further evaluation.   HOSPITAL COURSE:   * Acute bilateral PE with out right heart strain per echo Was On heparin drip. IVC filter placed by Dr. Lucky Cowboy. Switched to Xarelto at discharge.  * Acute hypoxic respiratory failure Due to bilateral pulmonary embolism. Wean oxygen as tolerated.  * Hypertension Continue home medications  * Elevated troponin Due to demand ischemia. His is not MI. [facility] input.  Stable at time of discharge.  She does remain at high risk for complications in the future.   CONSULTS OBTAINED:  Treatment Team:  Corey Skains, MD  DRUG ALLERGIES:  No Known Allergies  DISCHARGE MEDICATIONS:   Discharge Medication List as of 01/31/2015  2:20 PM    START taking these medications   Details  ipratropium-albuterol (DUONEB) 0.5-2.5 (3) MG/3ML SOLN Take 3 mLs by nebulization every 6 (six) hours as needed., Starting 01/31/2015, Until Discontinued, Print    pantoprazole (PROTONIX) 40 MG tablet Take 1 tablet (40 mg total) by mouth daily.,  Starting 01/31/2015, Until Discontinued, Print    Rivaroxaban (XARELTO) 15 MG TABS tablet Take 1 tablet (15 mg total) by mouth 2 (two) times daily with a meal., Starting 01/31/2015, Until Discontinued, Print      CONTINUE these medications which have NOT CHANGED   Details  aspirin EC 325 MG tablet Take 325 mg by mouth every morning., Until Discontinued, Historical Med    Aspirin-Salicylamide-Caffeine (ARTHRITIS STRENGTH BC POWDER PO) Take 1 packet by mouth daily as needed. For pain., Until Discontinued, Historical Med         Today    VITAL SIGNS:  Blood pressure 136/83, pulse 92, temperature 98 F (36.7 C), temperature source Oral, resp. rate 20, height 5\' 6"  (1.676 m), weight 72.031 kg (158 lb 12.8 oz), SpO2 89 %.  I/O:  No intake or output data in the 24 hours ending 02/02/15 1330  PHYSICAL EXAMINATION:  Physical Exam  GENERAL:  81 y.o.-year-old patient lying in the bed with no acute distress.  LUNGS: Normal breath sounds bilaterally, no wheezing, rales,rhonchi or crepitation. No use of accessory muscles of respiration.  CARDIOVASCULAR: S1, S2 normal. No murmurs, rubs, or gallops.  ABDOMEN: Soft, non-tender, non-distended. Bowel sounds present. No organomegaly or mass.  NEUROLOGIC: Moves all 4 extremities. PSYCHIATRIC: The patient is alert and awake SKIN: No obvious rash, lesion, or ulcer.   DATA REVIEW:   CBC  Recent Labs Lab 01/31/15 0159  WBC 7.7  HGB 11.2*  HCT 34.1*  PLT 185  Chemistries   Recent Labs Lab 01/30/15 0400  NA 140  K 3.7  CL 110  CO2 26  GLUCOSE 150*  BUN 7  CREATININE 0.68  CALCIUM 8.1*  AST 23  ALT 13*  ALKPHOS 52  BILITOT 0.6    Cardiac Enzymes  Recent Labs Lab 01/30/15 0400  TROPONINI 0.45*    Microbiology Results  No results found for this or any previous visit.  RADIOLOGY:  No results found.    Follow up with PCP in 1 week.  Management plans discussed with the patient, family and they are in  agreement.  CODE STATUS:  Advance Directive Documentation        Most Recent Value   Type of Advance Directive  Healthcare Power of Attorney, Living will   Pre-existing out of facility DNR order (yellow form or pink MOST form)     "MOST" Form in Place?        TOTAL TIME TAKING CARE OF THIS PATIENT ON DAY OF DISCHARGE: more than 30 minutes.    Hillary Bow R M.D on 02/02/2015 at 1:30 PM  Between 7am to 6pm - Pager - [phone]  After 6pm go to [email or web address] - password [email or web address] [facility]  Office  [phone]  CC: Primary care physician; Madelyn Brunner, MD

## 2015-06-13 ENCOUNTER — Other Ambulatory Visit: Payer: Self-pay | Admitting: Vascular Surgery

## 2015-06-19 ENCOUNTER — Encounter: Payer: Self-pay | Admitting: *Deleted

## 2015-06-19 ENCOUNTER — Encounter
Admission: RE | Disposition: A | Discharge status: Home / Self Care | Payer: Self-pay | Source: Ambulatory Visit | Attending: Vascular Surgery

## 2015-06-19 ENCOUNTER — Ambulatory Visit
Admission: RE | Admit: 2015-06-19 | Discharge: 2015-06-19 | Disposition: A | Discharge status: Home / Self Care | Payer: Medicare Other | Source: Ambulatory Visit | Attending: Vascular Surgery | Admitting: Vascular Surgery

## 2015-06-19 DIAGNOSIS — Z9889 Other specified postprocedural states: Secondary | ICD-10-CM | POA: Diagnosis not present

## 2015-06-19 DIAGNOSIS — I2699 Other pulmonary embolism without acute cor pulmonale: Secondary | ICD-10-CM | POA: Insufficient documentation

## 2015-06-19 DIAGNOSIS — I868 Varicose veins of other specified sites: Secondary | ICD-10-CM | POA: Diagnosis not present

## 2015-06-19 DIAGNOSIS — I82512 Chronic embolism and thrombosis of left femoral vein: Secondary | ICD-10-CM | POA: Insufficient documentation

## 2015-06-19 DIAGNOSIS — Z79899 Other long term (current) drug therapy: Secondary | ICD-10-CM | POA: Diagnosis not present

## 2015-06-19 DIAGNOSIS — Z7902 Long term (current) use of antithrombotics/antiplatelets: Secondary | ICD-10-CM | POA: Diagnosis not present

## 2015-06-19 HISTORY — PX: PERIPHERAL VASCULAR CATHETERIZATION: SHX172C

## 2015-06-19 SURGERY — IVC FILTER REMOVAL
Wound class: Clean

## 2015-06-19 MED ORDER — CEFUROXIME SODIUM 1.5 G IJ SOLR
1.5000 g | INTRAMUSCULAR | Status: AC
  Administered 2015-06-19: 1.5 g via INTRAVENOUS
  Filled 2015-06-19: qty 1.5

## 2015-06-19 MED ORDER — MIDAZOLAM HCL 2 MG/2ML IJ SOLN
INTRAMUSCULAR | Status: AC
  Filled 2015-06-19: qty 2

## 2015-06-19 MED ORDER — LIDOCAINE-EPINEPHRINE (PF) 1 %-1:200000 IJ SOLN
INTRAMUSCULAR | Status: AC
  Filled 2015-06-19: qty 30

## 2015-06-19 MED ORDER — FENTANYL CITRATE (PF) 100 MCG/2ML IJ SOLN
INTRAMUSCULAR | Status: DC | PRN
  Administered 2015-06-19 (×2): 50 ug via INTRAVENOUS

## 2015-06-19 MED ORDER — HEPARIN (PORCINE) IN NACL 2-0.9 UNIT/ML-% IJ SOLN
INTRAMUSCULAR | Status: AC
  Filled 2015-06-19: qty 500

## 2015-06-19 MED ORDER — MIDAZOLAM HCL 2 MG/2ML IJ SOLN
INTRAMUSCULAR | Status: DC | PRN
  Administered 2015-06-19: 2 mg via INTRAVENOUS

## 2015-06-19 MED ORDER — FENTANYL CITRATE (PF) 100 MCG/2ML IJ SOLN
INTRAMUSCULAR | Status: AC
  Filled 2015-06-19: qty 2

## 2015-06-19 MED ORDER — SODIUM CHLORIDE 0.9 % IV SOLN
INTRAVENOUS | Status: DC
  Administered 2015-06-19: 10:00:00 via INTRAVENOUS

## 2015-06-19 SURGICAL SUPPLY — 4 items
PACK ANGIOGRAPHY (CUSTOM PROCEDURE TRAY) ×3 IMPLANT
SET VENACAVA FILTER RETRIEVAL (MISCELLANEOUS) ×3 IMPLANT
TOWEL OR 17X26 4PK STRL BLUE (TOWEL DISPOSABLE) ×3 IMPLANT
WIRE J 3MM .035X145CM (WIRE) ×3 IMPLANT

## 2015-06-19 NOTE — H&P (Signed)
  [facility] History & Physical Update  The patient was interviewed and re-examined.  The patient's previous History and Physical has been reviewed and is unchanged.  There is no change in the plan of care. We plan to proceed with the scheduled procedure.  DEW,JASON, MD  06/19/2015, 9:48 AM

## 2015-06-19 NOTE — Op Note (Signed)
    OPERATIVE NOTE   PROCEDURE: 1. Ultrasound guidance for vascular access to right jugular vein. 2. Catheter placement into IVC from right jugular vein. 3. Inferior venacavogram. 4. Retrieval of IVC filter.  PRE-OPERATIVE DIAGNOSIS: 1. Status post IVC filter for previous DVT    POST-OPERATIVE DIAGNOSIS: Same as above  SURGEON: Leotis Pain, MD  ASSISTANT(S): None  ANESTHESIA: local with sedation  ESTIMATED BLOOD LOSS: minimal  FINDING(S): 1. Patent IVC  SPECIMEN(S): Filter retrieved and disposed of  INDICATIONS:  Patient is a 81 year old female who presents with a previous history of IVC filter placement and DVT.  The patient desires removal of the filter to avoid the small lifetime risks of perforation, infection, thrombosis, migration, and stent fracture.  Risks and benefits of removal were discussed and the patient is agreeable to proceed.  The patient understands that the filter may not be able to be retrieved if the top of the filter has embedded in the caval wall.   DESCRIPTION: After obtaining full informed written consent, the patient was brought back to the operating room and placed supine upon the operating table. After obtaining adequate sedation, the patient was prepped and draped in the standard fashion. The right jugular vein was visualized with ultrasound and found to be widely patent. This was accessed under direct ultrasound guidance with a Seldinger needle and a permanent image was recorded. A J-wire was then placed. After skin nick and dilatation, the retrieval sheath was placed over the wire into the inferior vena cava. Inferior venacavogram was then performed. The IVC was found to be widely patent and the filter was in good location. I then was able to snare the hook on the top of the filter and advanced the sheath over the filter collapsing it and bringing it into the sheath in its entirety. It was then removed through the sheath in its entirety. The sheath  was then removed and pressure was held on the neck. The patient tolerated the procedure well and was taken to the recovery room in stable condition.  COMPLICATIONS: None  CONDITION: Stable   DEW,JASON 06/19/2015 10:25 AM

## 2016-11-23 IMAGING — US US EXTREM LOW VENOUS BILAT
1 series · 13 of 24 positions shown · non-contrast
Comparison: None.

CLINICAL DATA: Pulmonary embolism, bilateral and extensive.
Bilateral lower extremity swelling. Symptoms for 3 weeks.



[Series 1: us extrem low venous bilat · 0.08mm/px · 13 of 65 slices shown]
[im 1/65]
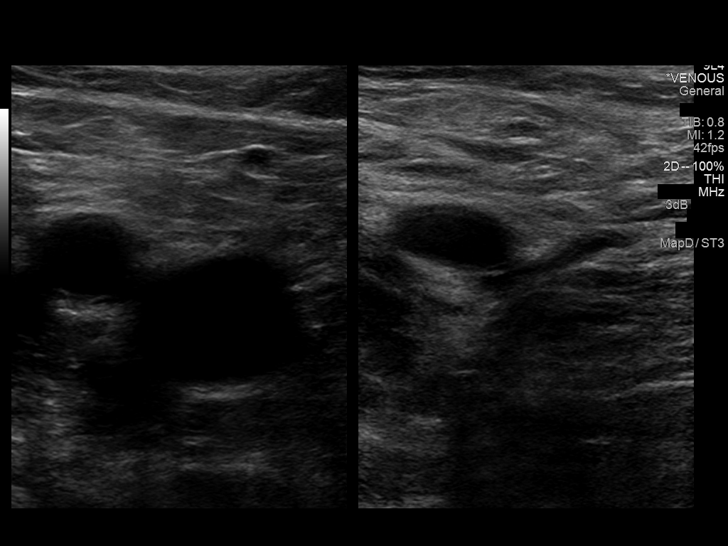
[im 6/65]
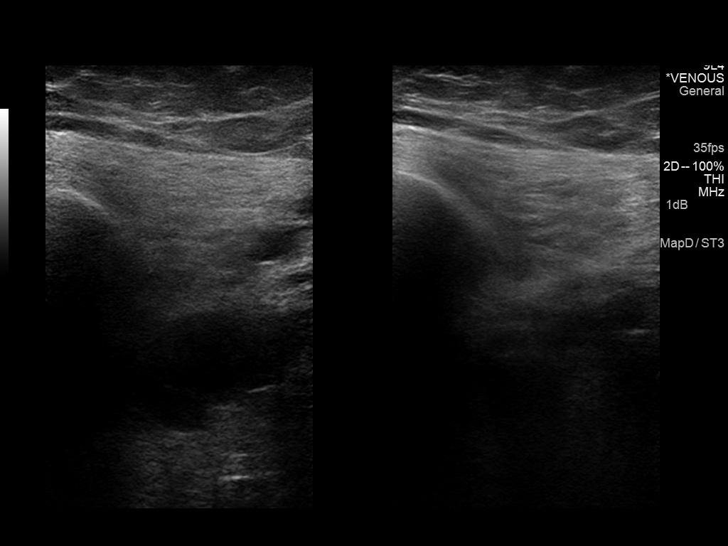
[im 12/65]
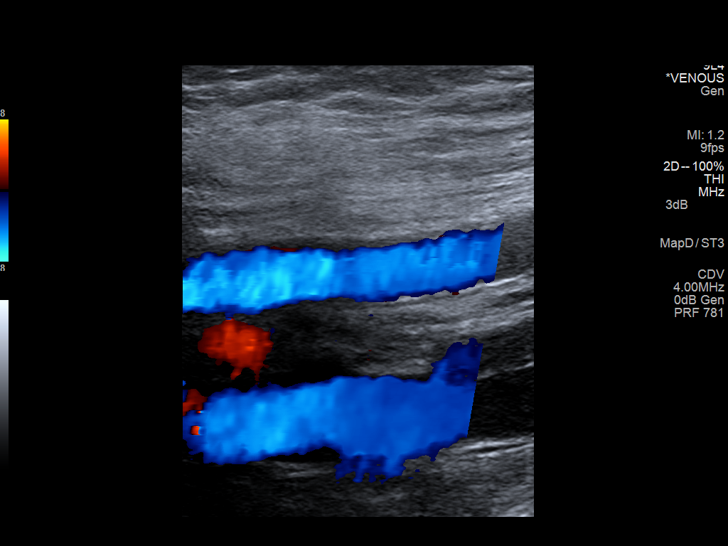
[im 17/65]
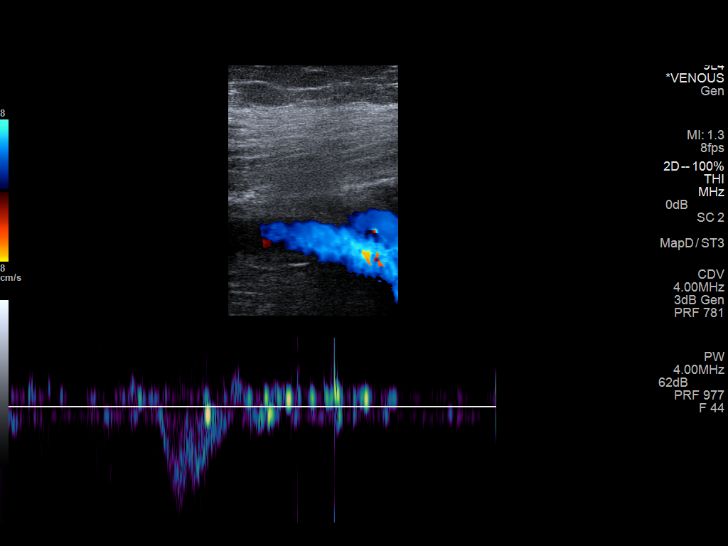
[im 23/65]
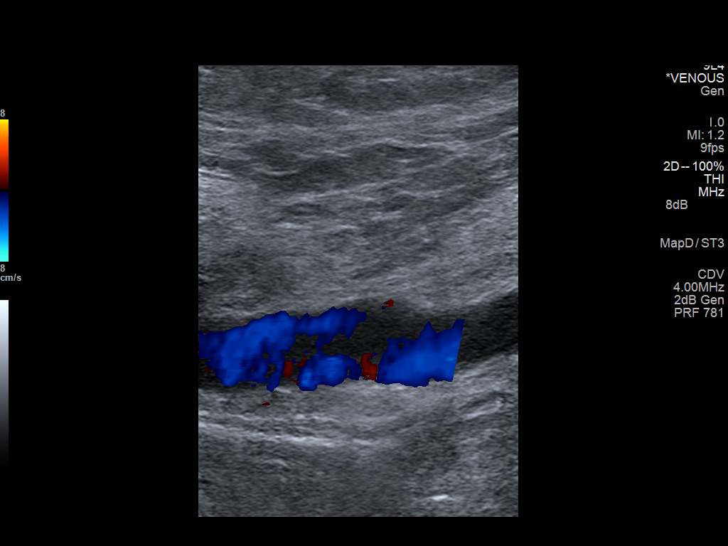
[im 28/65]
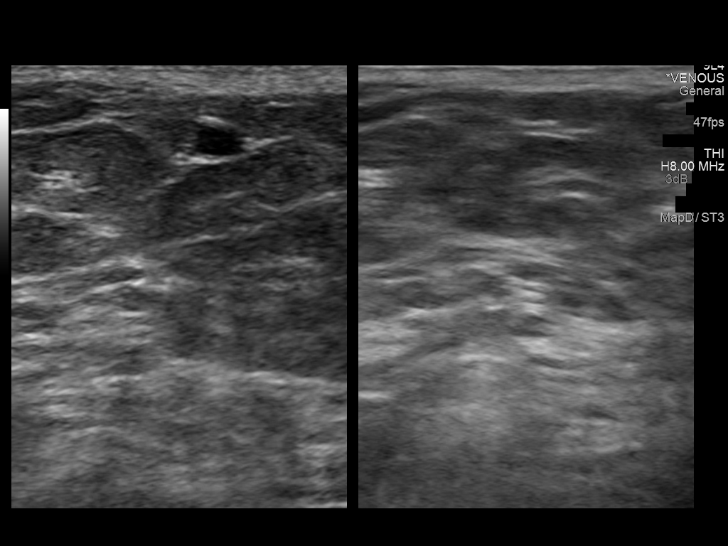
[im 34/65]
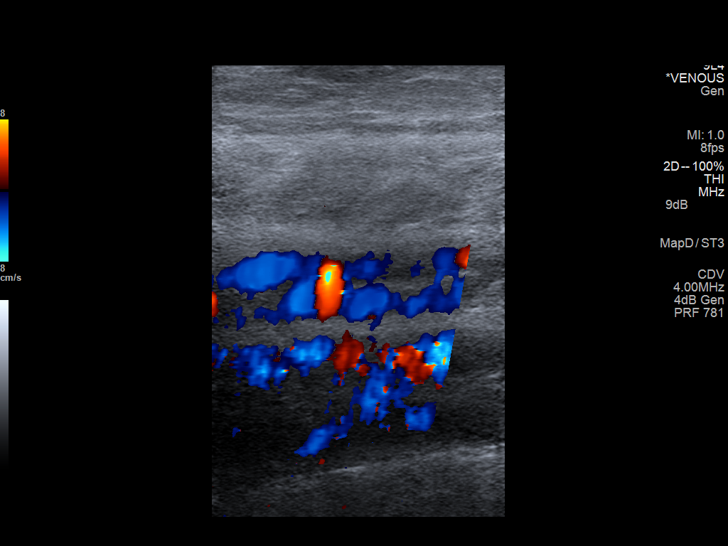
[im 37/65]
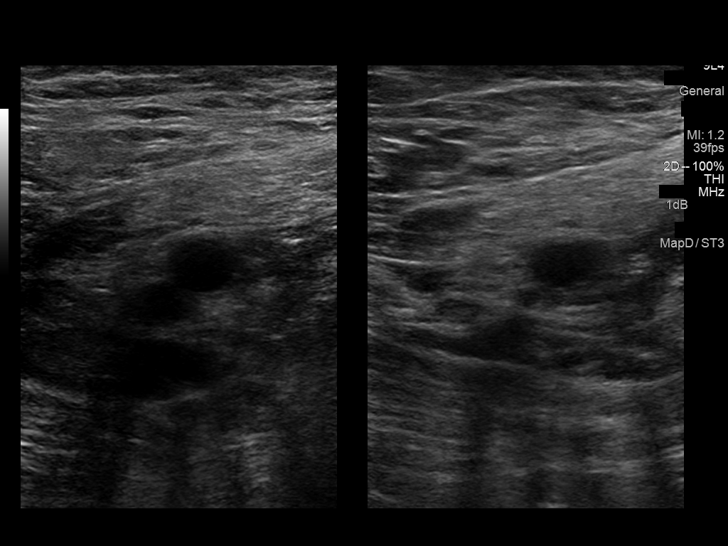
[im 42/65]
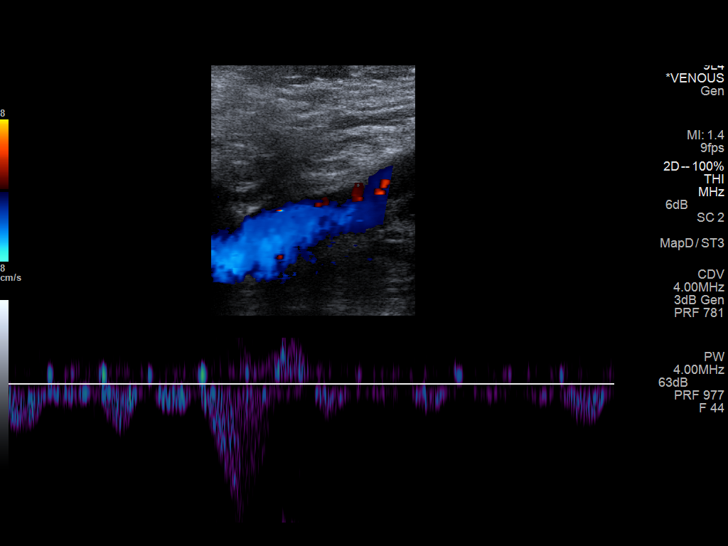
[im 48/65]
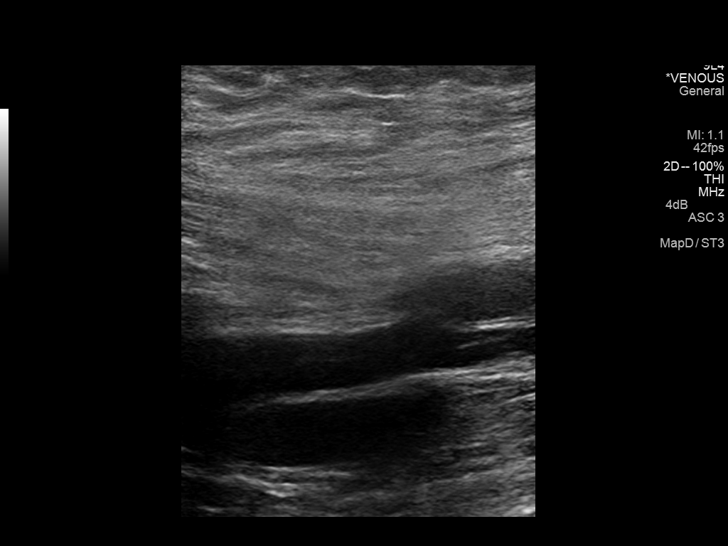
[im 53/65]
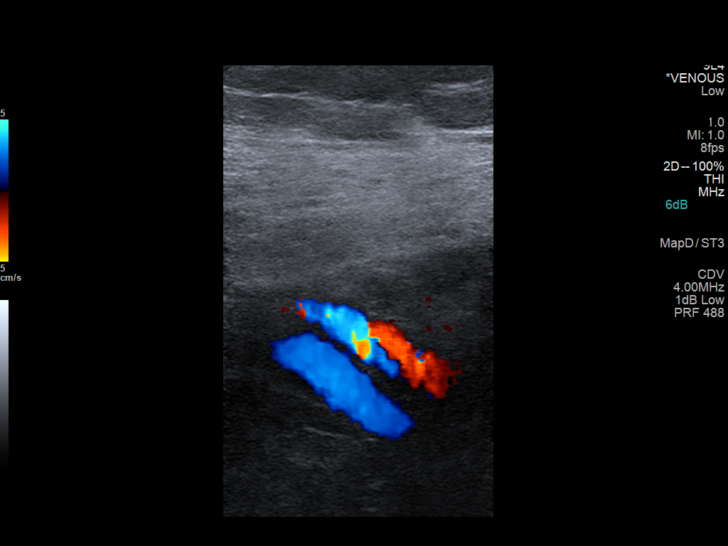
[im 59/65]
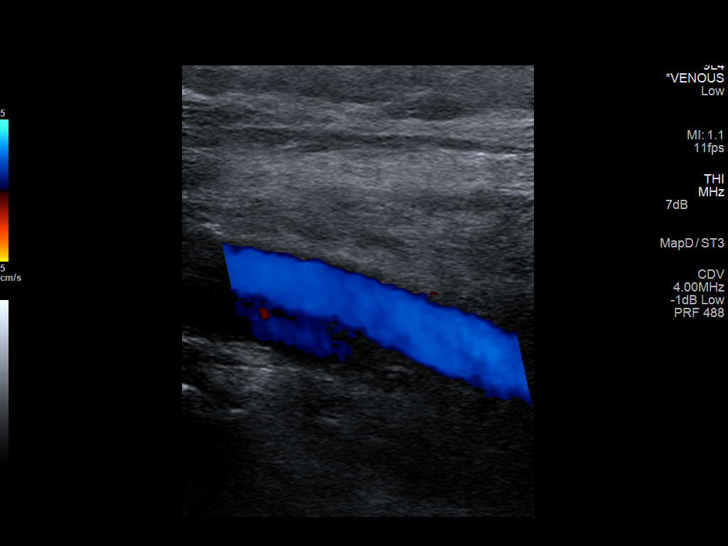
[im 65/65]
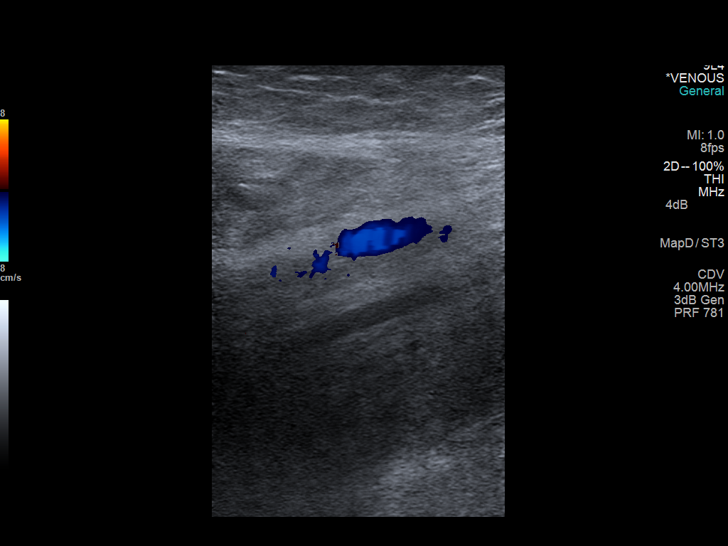

[13 of 24 positions shown; findings below may reference images not displayed]

FINDINGS: RIGHT LOWER EXTREMITY

Stools

Common Femoral Vein: No evidence of thrombus. Normal
compressibility, respiratory phasicity and response to augmentation.

Saphenofemoral Junction: No evidence of thrombus. Normal
compressibility and flow on color Doppler imaging.

Profunda Femoral Vein: No evidence of thrombus. Normal
compressibility and flow on color Doppler imaging.

Femoral Vein: No evidence of thrombus. Normal compressibility,
respiratory phasicity and response to augmentation.

Popliteal Vein: Nonocclusive thrombus is identified within the right
proximal popliteal vein. Vein is not fully compressible but shows
normal vague density.

Calf Veins: No evidence of thrombus. Normal compressibility and flow
on color Doppler imaging.

Superficial Great Saphenous Vein: No evidence of thrombus. Normal
compressibility and flow on color Doppler imaging.

Venous Reflux:  None.

Other Findings:  None.

LEFT LOWER EXTREMITY

Common Femoral Vein: No evidence of thrombus. Normal
compressibility, respiratory phasicity and response to augmentation.

Saphenofemoral Junction: No evidence of thrombus. Normal
compressibility and flow on color Doppler imaging.

Profunda Femoral Vein: No evidence of thrombus. Normal
compressibility and flow on color Doppler imaging.

Femoral Vein: Nonocclusive thrombus is identified within the
proximal left femoral vein. Vein is not fully compressible but shows
normal phase is the.

Popliteal Vein: No evidence of thrombus. Normal compressibility,
respiratory phasicity and response to augmentation.

Calf Veins: Peroneal vein is not well seen. The posterior tibial
vein is normal in appearance.

Superficial Great Saphenous Vein: No evidence of thrombus. Normal
compressibility and flow on color Doppler imaging.

Venous Reflux:  None.

Other Findings:  None.
IMPRESSION: 1. Nonocclusive thrombus within the right popliteal vein
2. Nonocclusive thrombus within the left femoral vein.

## 2020-01-01 ENCOUNTER — Other Ambulatory Visit: Payer: Self-pay

## 2020-01-01 ENCOUNTER — Emergency Department: Payer: Medicare Other

## 2020-01-01 ENCOUNTER — Encounter: Payer: Self-pay | Admitting: Emergency Medicine

## 2020-01-01 ENCOUNTER — Inpatient Hospital Stay
Admission: EM | Admit: 2020-01-01 | Discharge: 2020-01-13 | DRG: 689 | Disposition: E | Payer: Medicare Other | Attending: Internal Medicine | Admitting: Internal Medicine

## 2020-01-01 DIAGNOSIS — Z66 Do not resuscitate: Secondary | ICD-10-CM | POA: Diagnosis present

## 2020-01-01 DIAGNOSIS — Z95828 Presence of other vascular implants and grafts: Secondary | ICD-10-CM | POA: Diagnosis not present

## 2020-01-01 DIAGNOSIS — G9341 Metabolic encephalopathy: Secondary | ICD-10-CM | POA: Diagnosis present

## 2020-01-01 DIAGNOSIS — E876 Hypokalemia: Secondary | ICD-10-CM | POA: Diagnosis not present

## 2020-01-01 DIAGNOSIS — Z20822 Contact with and (suspected) exposure to covid-19: Secondary | ICD-10-CM | POA: Diagnosis present

## 2020-01-01 DIAGNOSIS — I10 Essential (primary) hypertension: Secondary | ICD-10-CM | POA: Diagnosis present

## 2020-01-01 DIAGNOSIS — N136 Pyonephrosis: Secondary | ICD-10-CM | POA: Diagnosis not present

## 2020-01-01 DIAGNOSIS — R630 Anorexia: Secondary | ICD-10-CM | POA: Diagnosis present

## 2020-01-01 DIAGNOSIS — D270 Benign neoplasm of right ovary: Secondary | ICD-10-CM | POA: Diagnosis present

## 2020-01-01 DIAGNOSIS — N390 Urinary tract infection, site not specified: Secondary | ICD-10-CM | POA: Diagnosis present

## 2020-01-01 DIAGNOSIS — A419 Sepsis, unspecified organism: Secondary | ICD-10-CM

## 2020-01-01 DIAGNOSIS — N3000 Acute cystitis without hematuria: Secondary | ICD-10-CM

## 2020-01-01 DIAGNOSIS — D271 Benign neoplasm of left ovary: Secondary | ICD-10-CM | POA: Diagnosis present

## 2020-01-01 DIAGNOSIS — Z86711 Personal history of pulmonary embolism: Secondary | ICD-10-CM

## 2020-01-01 DIAGNOSIS — Z90710 Acquired absence of both cervix and uterus: Secondary | ICD-10-CM

## 2020-01-01 DIAGNOSIS — R000 Tachycardia, unspecified: Secondary | ICD-10-CM | POA: Diagnosis present

## 2020-01-01 DIAGNOSIS — I2699 Other pulmonary embolism without acute cor pulmonale: Secondary | ICD-10-CM | POA: Diagnosis not present

## 2020-01-01 DIAGNOSIS — R410 Disorientation, unspecified: Secondary | ICD-10-CM | POA: Diagnosis not present

## 2020-01-01 DIAGNOSIS — Z86718 Personal history of other venous thrombosis and embolism: Secondary | ICD-10-CM

## 2020-01-01 DIAGNOSIS — R4182 Altered mental status, unspecified: Secondary | ICD-10-CM | POA: Diagnosis present

## 2020-01-01 DIAGNOSIS — I739 Peripheral vascular disease, unspecified: Secondary | ICD-10-CM | POA: Diagnosis present

## 2020-01-01 DIAGNOSIS — E871 Hypo-osmolality and hyponatremia: Secondary | ICD-10-CM | POA: Diagnosis not present

## 2020-01-01 DIAGNOSIS — I161 Hypertensive emergency: Secondary | ICD-10-CM | POA: Diagnosis present

## 2020-01-01 LAB — URINALYSIS, COMPLETE (UACMP) WITH MICROSCOPIC
Bilirubin Urine: NEGATIVE
Glucose, UA: NEGATIVE mg/dL
Ketones, ur: 5 mg/dL — AB
Nitrite: POSITIVE — AB
Protein, ur: 30 mg/dL — AB
Specific Gravity, Urine: 1.016 (ref 1.005–1.030)
pH: 6 (ref 5.0–8.0)

## 2020-01-01 LAB — CBC WITH DIFFERENTIAL/PLATELET
Abs Immature Granulocytes: 0.04 10*3/uL (ref 0.00–0.07)
Basophils Absolute: 0 10*3/uL (ref 0.0–0.1)
Basophils Relative: 0 %
Eosinophils Absolute: 0 10*3/uL (ref 0.0–0.5)
Eosinophils Relative: 0 %
HCT: 36.3 % (ref 36.0–46.0)
Hemoglobin: 12.6 g/dL (ref 12.0–15.0)
Immature Granulocytes: 1 %
Lymphocytes Relative: 13 %
Lymphs Abs: 0.8 10*3/uL (ref 0.7–4.0)
MCH: 32.6 pg (ref 26.0–34.0)
MCHC: 34.7 g/dL (ref 30.0–36.0)
MCV: 93.8 fL (ref 80.0–100.0)
Monocytes Absolute: 0.5 10*3/uL (ref 0.1–1.0)
Monocytes Relative: 8 %
Neutro Abs: 4.6 10*3/uL (ref 1.7–7.7)
Neutrophils Relative %: 78 %
Platelets: 164 10*3/uL (ref 150–400)
RBC: 3.87 MIL/uL (ref 3.87–5.11)
RDW: 14.5 % (ref 11.5–15.5)
WBC: 5.9 10*3/uL (ref 4.0–10.5)
nRBC: 0 % (ref 0.0–0.2)

## 2020-01-01 LAB — COMPREHENSIVE METABOLIC PANEL
ALT: 21 U/L (ref 0–44)
AST: 36 U/L (ref 15–41)
Albumin: 3.3 g/dL — ABNORMAL LOW (ref 3.5–5.0)
Alkaline Phosphatase: 50 U/L (ref 38–126)
Anion gap: 7 (ref 5–15)
BUN: 14 mg/dL (ref 8–23)
CO2: 22 mmol/L (ref 22–32)
Calcium: 7.9 mg/dL — ABNORMAL LOW (ref 8.9–10.3)
Chloride: 107 mmol/L (ref 98–111)
Creatinine, Ser: 0.59 mg/dL (ref 0.44–1.00)
GFR calc Af Amer: >60 mL/min (ref >60)
GFR calc non Af Amer: >60 mL/min (ref >60)
Glucose, Bld: 144 mg/dL — ABNORMAL HIGH (ref 70–99)
Potassium: 3.5 mmol/L (ref 3.5–5.1)
Sodium: 136 mmol/L (ref 135–145)
Total Bilirubin: 1.2 mg/dL (ref 0.3–1.2)
Total Protein: 5.9 g/dL — ABNORMAL LOW (ref 6.5–8.1)

## 2020-01-01 LAB — SARS CORONAVIRUS 2 BY RT PCR (HOSPITAL ORDER, PERFORMED IN ~~LOC~~ HOSPITAL LAB): SARS Coronavirus 2: NEGATIVE

## 2020-01-01 LAB — PROTIME-INR
INR: 1.2 (ref 0.8–1.2)
Prothrombin Time: 14.3 seconds (ref 11.4–15.2)

## 2020-01-01 LAB — LACTIC ACID, PLASMA: Lactic Acid, Venous: 1 mmol/L (ref 0.5–1.9)

## 2020-01-01 MED ORDER — LACTATED RINGERS IV BOLUS
1000.0000 mL | Freq: Once | INTRAVENOUS | Status: AC
  Administered 2020-01-01: 1000 mL via INTRAVENOUS

## 2020-01-01 MED ORDER — ONDANSETRON HCL 4 MG PO TABS: 4.0000 mg | ORAL_TABLET | Freq: Four times a day (QID) | ORAL | Status: DC | PRN

## 2020-01-01 MED ORDER — SODIUM CHLORIDE 0.9 % IV SOLN
2.0000 g | Freq: Once | INTRAVENOUS | Status: AC
  Administered 2020-01-01: 2 g via INTRAVENOUS
  Filled 2020-01-01: qty 20

## 2020-01-01 MED ORDER — SODIUM CHLORIDE 0.9 % IV SOLN: INTRAVENOUS | Status: DC

## 2020-01-01 MED ORDER — SODIUM CHLORIDE 0.9 % IV SOLN
1.0000 g | INTRAVENOUS | Status: DC
  Administered 2020-01-01 – 2020-01-02 (×2): 1 g via INTRAVENOUS
  Filled 2020-01-01: qty 1
  Filled 2020-01-01: qty 10
  Filled 2020-01-01: qty 1

## 2020-01-01 MED ORDER — HEPARIN SODIUM (PORCINE) 5000 UNIT/ML IJ SOLN
5000.0000 [IU] | Freq: Three times a day (TID) | INTRAMUSCULAR | Status: DC
  Administered 2020-01-01 – 2020-01-02 (×4): 5000 [IU] via SUBCUTANEOUS
  Filled 2020-01-01 (×5): qty 1

## 2020-01-01 MED ORDER — ACETAMINOPHEN 325 MG PO TABS
650.0000 mg | ORAL_TABLET | Freq: Three times a day (TID) | ORAL | Status: DC | PRN
  Administered 2020-01-01: 650 mg via ORAL
  Filled 2020-01-01: qty 2

## 2020-01-01 MED ORDER — ONDANSETRON HCL 4 MG/2ML IJ SOLN: 4.0000 mg | Freq: Four times a day (QID) | INTRAMUSCULAR | Status: DC | PRN

## 2020-01-01 NOTE — ED Notes (Signed)
Pt moving with bp measurement, difficult to obtain bp as pt keeps moving.

## 2020-01-01 NOTE — ED Triage Notes (Signed)
Pt to ED via [facility] from home for altered mental status. Per EMS pt husband reports increased weakness and altered mental status x 1 day. Pt was treated for UTI 3 weeks ago. EMS reports that pt was febrile for them at 102.5, blood pressure was 208/98, RR 34, and CBG 121. PT was given 1 liter NS in route and has 18 G IV in her Lt forearm. Pt is in NAD.

## 2020-01-01 NOTE — ED Provider Notes (Signed)
[facility] Emergency Department Provider Note  ____________________________________________   First MD Initiated Contact with Patient 01/02/2020 1534     (approximate)  I have reviewed the triage vital signs and the nursing notes.   HISTORY  Chief Complaint Altered Mental Status    HPI Courtney Simon is a 86 y.o. female  Here with AMS. History provided by pt and EMS, primarily EMS 2/2 confusion. Per report, pt has been treated for a UTI for the past several days. She is on approx day 5 of ABX. Over the past day, she has reportedly had increasing confusion, weakness. She has also begun having fevers and poor appetite. Temp 102 per EMS. Unknown which ABX she is on. On my report, pt confused but does tell me she "feels awful." She "feels dry" as well and admits to not eating/drinking much. Denies any overt CP, SOB, abd pain, n/v/d. Does have +dysuria and increasingly foul-smelling urine.  Level 5 caveat invoked as remainder of history, ROS, and physical exam limited due to patient's confusion.         Past Medical History:  Diagnosis Date  . Arthritis   . DVT (deep venous thrombosis) [facility])     Patient Active Problem List   Diagnosis Date Noted  . UTI (urinary tract infection) 12/29/2019  . AMS (altered mental status) 01/06/2020  . S/P IVC filter 02/02/2015  . DVT (deep venous thrombosis) ([place]) 01/30/2015  . Pulmonary embolism ([place]) 01/29/2015  . Acute respiratory failure ([place]) 01/29/2015    Past Surgical History:  Procedure Laterality Date  . ABDOMINAL HYSTERECTOMY    . BREAST SURGERY    . CHOLECYSTECTOMY    . gum graft    . PERIPHERAL VASCULAR CATHETERIZATION N/A 01/30/2015   Procedure: IVC Filter Insertion;  Surgeon: Algernon Huxley, MD;  Location: [place] CV LAB;  Service: Cardiovascular;  Laterality: N/A;  . PERIPHERAL VASCULAR CATHETERIZATION N/A 06/19/2015   Procedure: IVC Filter Removal;  Surgeon: Algernon Huxley, MD;  Location: [place] CV LAB;  Service: Cardiovascular;  Laterality: N/A;    Prior to Admission medications   Medication Sig Start Date End Date Taking? Authorizing Provider  acetaminophen (TYLENOL) 650 MG CR tablet Take 650 mg by mouth every 8 (eight) hours as needed for pain.   Yes [provider]    Allergies Patient has no known allergies.  No family history on file.  Social History Social History   Tobacco Use  . Smoking status: Never Smoker  . Smokeless tobacco: Never Used  Substance Use Topics  . Alcohol use: No  . Drug use: Not Currently    Review of Systems  Review of Systems  Unable to perform ROS: Mental status change  Constitutional: Positive for fatigue. Negative for fever.  HENT: Negative for congestion and sore throat.   Eyes: Negative for visual disturbance.  Respiratory: Negative for cough and shortness of breath.   Cardiovascular: Negative for chest pain.  Gastrointestinal: Negative for abdominal pain, diarrhea, nausea and vomiting.  Genitourinary: Positive for dysuria. Negative for flank pain.  Musculoskeletal: Negative for back pain and neck pain.  Skin: Negative for rash and wound.  Neurological: Positive for weakness.  Psychiatric/Behavioral: Positive for confusion.  All other systems reviewed and are negative.    ____________________________________________  PHYSICAL EXAM:      VITAL SIGNS: ED Triage Vitals  Enc Vitals Group     BP 12/28/2019 1539 (!) 164/88     Pulse Rate 12/26/2019 1539 92  Resp 12/29/2019 1542 (!) 25     Temp --      Temp src --      SpO2 01/05/2020 1539 94 %     Weight 12/19/2019 1540 147 lb 11.3 oz (67 kg)     Height 12/17/2019 1540 5\' 6"  (1.676 m)     Head Circumference --      Peak Flow --      Pain Score --      Pain Loc --      Pain Edu? --      Excl. in [place]? --      Physical Exam Vitals and nursing note reviewed.  Constitutional:      General: She is not in acute distress.    Appearance: She is well-developed.    HENT:     Head: Normocephalic and atraumatic.     Mouth/Throat:     Mouth: Mucous membranes are dry.     Comments: Markedly dry MM Eyes:     Conjunctiva/sclera: Conjunctivae normal.  Cardiovascular:     Rate and Rhythm: Regular rhythm. Tachycardia present.     Heart sounds: Normal heart sounds.  Pulmonary:     Effort: Pulmonary effort is normal. No respiratory distress.     Breath sounds: No wheezing.  Abdominal:     General: Abdomen is flat. There is no distension.     Tenderness: There is abdominal tenderness (mild suprapubic).  Musculoskeletal:     Cervical back: Neck supple.  Skin:    General: Skin is warm.     Capillary Refill: Capillary refill takes less than 2 seconds.     Findings: No rash.  Neurological:     Mental Status: She is alert.     Motor: No abnormal muscle tone.     Comments: Oriented to person, place, not time. No CN deficits. MAE with 5/5 strength. Normal sensation to light touch BL UE and LE.       ____________________________________________   LABS (all labs ordered are listed, but only abnormal results are displayed)  Labs Reviewed  COMPREHENSIVE METABOLIC PANEL - Abnormal; Notable for the following components:      Result Value   Glucose, Bld 144 (*)    Calcium 7.9 (*)    Total Protein 5.9 (*)    Albumin 3.3 (*)    All other components within normal limits  URINALYSIS, COMPLETE (UACMP) WITH MICROSCOPIC - Abnormal; Notable for the following components:   Color, Urine YELLOW (*)    APPearance HAZY (*)    Hgb urine dipstick SMALL (*)    Ketones, ur 5 (*)    Protein, ur 30 (*)    Nitrite POSITIVE (*)    Leukocytes,Ua SMALL (*)    Bacteria, UA RARE (*)    All other components within normal limits  SARS CORONAVIRUS 2 BY RT PCR (HOSPITAL ORDER, [place] [facility])  CULTURE, BLOOD (ROUTINE X 2)  CULTURE, BLOOD (ROUTINE X 2)  LACTIC ACID, PLASMA  CBC WITH DIFFERENTIAL/PLATELET  PROTIME-INR  COMPREHENSIVE METABOLIC PANEL   CBC    ____________________________________________  EKG: Normal sinus rhythm, VR 96. QRS 109, QTc 433. LVH with secondary repol. PVC. No acute St elevations or depressions. ________________________________________  RADIOLOGY All imaging, including plain films, CT scans, and ultrasounds, independently reviewed by me, and interpretations confirmed via formal radiology reads.  ED MD interpretation:   CT Head: No acute abnormality CT Stone: Large bilateral adnexal cystic lesions, distention of urinary bladder, sigmoid diverticulosis, CXR:  Cardiomegaly with mild edema  Official radiology report(s): CT Head Wo Contrast  Result Date: 12/20/2019 CLINICAL DATA:  Encephalopathy. EXAM: CT HEAD WITHOUT CONTRAST TECHNIQUE: Contiguous axial images were obtained from the base of the skull through the vertex without intravenous contrast. COMPARISON:  01/29/2015 FINDINGS: Brain: Ventricles and cisterns are unchanged. Evidence of age related atrophic change. Chronic ischemic microvascular disease is present. There is no mass, mass effect, shift of midline structures or acute hemorrhage. No evidence of acute hemorrhage. Vascular: No hyperdense vessel or unexpected calcification. Skull: No acute fracture. Sinuses/Orbits: Orbits are normal. Paranasal sinuses are well developed and aerated. Other: None. IMPRESSION: 1.  No acute findings. 2.  Age related atrophy and chronic ischemic microvascular disease. Electronically Signed   By: Marin Olp M.D.   On: 12/18/2019 17:28   DG Chest Port 1 View  Result Date: 12/30/2019 CLINICAL DATA:  Altered mental status. EXAM: PORTABLE CHEST 1 VIEW COMPARISON:  None. FINDINGS: Cardiomegaly. Mild interstitial prominence suggesting mild edema. Cardiomegaly. The hila and mediastinum are unchanged. No pneumothorax. IMPRESSION: Cardiomegaly and mild edema. Electronically Signed   By: Dorise Bullion III M.D   On: 01/05/2020 16:11   CT Renal Stone Study  Result Date:  01/02/2020 CLINICAL DATA:  Hematuria EXAM: CT ABDOMEN AND PELVIS WITHOUT CONTRAST TECHNIQUE: Multidetector CT imaging of the abdomen and pelvis was performed following the standard protocol without IV contrast. COMPARISON:  None. FINDINGS: Lower chest: Bibasilar subsegmental atelectasis. Coronary artery calcification. Calcified granuloma within the right middle lobe. Hepatobiliary: Numerous scattered punctate calcifications throughout the liver suggests sequela of chronic granulomatous disease. No focal liver lesion is identified. Gallbladder not identified, likely surgically absent. Pancreas: Pancreatic atrophy. No ductal dilatation or inflammatory changes. Spleen: Numerous splenic calcifications compatible with chronic granulomatous disease. Otherwise unremarkable. Adrenals/Urinary Tract: Unremarkable adrenal glands. Kidneys have an unremarkable noncontrast appearance. No renal stone. Mild bilateral hydroureteronephrosis. No ureteral calculi identified. Urinary bladder is mildly distended but appears otherwise unremarkable. Stomach/Bowel: Small hiatal hernia. No dilated loops of bowel. Extensive sigmoid diverticulosis. No pericolonic inflammatory changes. Vascular/Lymphatic: Scattered aortoiliac atherosclerotic calcifications without aneurysm. No abdominopelvic lymphadenopathy. Reproductive: Large bilateral adnexal cystic lesions. Left adnexal cystic lesion measures 9.0 x 8.2 x 8.8 cm. Dominant right adnexal cystic lesion measures 7.5 x 6.3 x 5.8 cm which appears slightly multiloculated. Surgically absent uterus. Other: No free fluid.  No free air.  No abdominal wall hernia. Musculoskeletal: Degenerative lumbar spondylosis. No acute or significant osseous findings. IMPRESSION: 1. Large bilateral adnexal cystic lesions, measuring up to 8.8 cm on the left and 7.5 cm on the right. Findings are concerning for ovarian cystadenomas. Further evaluation with pelvic ultrasound can be performed as clinically indicated. 2.  Distention of the urinary bladder with mild bilateral hydroureteronephrosis. No urinary tract calcification identified. Correlate for urinary retention. 3. Extensive sigmoid diverticulosis without evidence of acute diverticulitis. 4. Small hiatal hernia. 5. Aortic atherosclerosis. (ICD10-I70.0). Electronically Signed   By: Davina Poke D.O.   On: 01/11/2020 17:41    ____________________________________________  PROCEDURES   Procedure(s) performed (including Critical Care):  .Critical Care Performed by: Duffy Bruce, MD Authorized by: Duffy Bruce, MD   Critical care provider statement:    Critical care time (minutes):  35   Critical care time was exclusive of:  Separately billable procedures and treating other patients and teaching time   Critical care was time spent personally by me on the following activities:  Development of treatment plan with patient or surrogate, discussions with consultants, evaluation of patient's response to treatment,  examination of patient, obtaining history from patient or surrogate, ordering and performing treatments and interventions, ordering and review of laboratory studies, ordering and review of radiographic studies, pulse oximetry, re-evaluation of patient's condition and review of old charts   I assumed direction of critical care for this patient from another provider in my specialty: no   .1-3 Lead EKG Interpretation Performed by: Duffy Bruce, MD Authorized by: Duffy Bruce, MD     Interpretation: normal     ECG rate:  80-100   ECG rate assessment: normal     Rhythm: sinus rhythm     Ectopy: none     Conduction: normal   Comments:     Indication: Sepsis    ____________________________________________  INITIAL IMPRESSION / MDM / [place] / ED COURSE  As part of my medical decision making, I reviewed the following data within the [facility] notes reviewed and incorporated, Old chart  reviewed, Notes from prior ED visits, and [place] Controlled Substance Database       *TRISTEN PENNINO was evaluated in Emergency Department on 12/18/2019 for the symptoms described in the history of present illness. She was evaluated in the context of the global COVID-19 pandemic, which necessitated consideration that the patient might be at risk for infection with the SARS-CoV-2 virus that causes COVID-19. Institutional protocols and algorithms that pertain to the evaluation of patients at risk for COVID-19 are in a state of rapid change based on information released by regulatory bodies including the CDC and federal and state organizations. These policies and algorithms were followed during the patient's care in the ED.  Some ED evaluations and interventions may be delayed as a result of limited staffing during the pandemic.*     Medical Decision Making: 86 year old female here with fever, altered mental status.  Patient has recently been treated for UTI.  Patient febrile, tachycardic here but nontoxic.  Lactic acid is normal which is reassuring.  Urinalysis does show UTI.  Patient noted to have possible cystadenomas on her CT, will need work-up.  Suspect recurrent UTI with associated encephalopathy.  IV fluids, antibiotics given.  Will admit to medicine. Blood cultures, urine cultures sent. Renal function is at baseline. No focal neuro deficits. CT head shows NAICA.   ____________________________________________  FINAL CLINICAL IMPRESSION(S) / ED DIAGNOSES  Final diagnoses:  Delirium  Acute cystitis without hematuria     MEDICATIONS GIVEN DURING THIS VISIT:  Medications  acetaminophen (TYLENOL) tablet 650 mg (has no administration in time range)  heparin injection 5,000 Units (5,000 Units Subcutaneous Given 12/21/2019 2249)  0.9 %  sodium chloride infusion ( Intravenous New Bag/Given 12/26/2019 2247)  ondansetron (ZOFRAN) tablet 4 mg (has no administration in time range)    Or  ondansetron  (ZOFRAN) injection 4 mg (has no administration in time range)  cefTRIAXone (ROCEPHIN) 1 g in sodium chloride 0.9 % 100 mL IVPB (1 g Intravenous New Bag/Given 01/11/2020 2248)  lactated ringers bolus 1,000 mL (0 mLs Intravenous Stopped 12/21/2019 1810)  cefTRIAXone (ROCEPHIN) 2 g in sodium chloride 0.9 % 100 mL IVPB (0 g Intravenous Stopped 01/02/2020 2056)     ED Discharge Orders    None       Note:  This document was prepared using Dragon voice recognition software and may include unintentional dictation errors.   Duffy Bruce, MD 12/30/2019 [phone]

## 2020-01-01 NOTE — ED Notes (Signed)
Floor called pt coming, husband coming per [facility], RN

## 2020-01-01 NOTE — ED Notes (Signed)
Pt pulling at IVs,Left forearm with blood around IV but flushing well. Pt's IV's wrapped in Kerlex. Pt states she wants to go home. Husband at bedside.

## 2020-01-01 NOTE — H&P (Signed)
History and Physical   Courtney Simon ZOX:[id number] DOB: 06/12/1922 DOA: 12/24/2019  Referring MD/NP/PA: Dr. Ellender Hose  PCP: Baxter Hire, MD   Outpatient Specialists: None  Patient coming from: Home  Chief Complaint: Altered mental status  HPI: Courtney Simon is a 86 y.o. female with medical history significant of recurrent DVTs, osteoarthritis, peripheral vascular disease, history of IVC filter who presented to the ER via EMS with confusion.  Patient has had significant confusion but no agitation.  Denied any fever or chills.  Denied any nausea vomiting or diarrhea.  She was seen and evaluated in the ER found to have UTI.  She is not a good historian so history is obtained mainly from family.  Patient has improved with hydration.  Not a good historian still constantly feels confused.  No evidence of sepsis.  Patient being admitted with acute metabolic encephalopathy secondary to UTI..  ED Course: Temperature is 100.4 blood pressure 230/151 initially currently 160/97 pulse 147 respirate 32 oxygen sat 94% on room air.  Chemistry largely within normal except for albumin 3.3 and glucose 144.  CBC also within normal.  Urinalysis shows hazy urine with some leukocyte positive nitrite WBC 21-50) bacteria.  Patient therefore being admitted with metabolic encephalopathy secondary to UTI.  Review of Systems: As per HPI otherwise 10 point review of systems negative.    Past Medical History:  Diagnosis Date  . Arthritis   . DVT (deep venous thrombosis) ([facility])     Past Surgical History:  Procedure Laterality Date  . ABDOMINAL HYSTERECTOMY    . BREAST SURGERY    . CHOLECYSTECTOMY    . gum graft    . PERIPHERAL VASCULAR CATHETERIZATION N/A 01/30/2015   Procedure: IVC Filter Insertion;  Surgeon: Algernon Huxley, MD;  Location: [facility];  Service: Cardiovascular;  Laterality: N/A;  . PERIPHERAL VASCULAR CATHETERIZATION N/A 06/19/2015   Procedure: IVC Filter Removal;  Surgeon: Algernon Huxley,  MD;  Location: [facility];  Service: Cardiovascular;  Laterality: N/A;     reports that she has never smoked. She has never used smokeless tobacco. She reports previous drug use. She reports that she does not drink alcohol.  No Known Allergies  No family history on file.   Prior to Admission medications   Medication Sig Start Date End Date Taking? Authorizing Provider  acetaminophen (TYLENOL) 650 MG CR tablet Take 650 mg by mouth every 8 (eight) hours as needed for pain.   Yes [provider]    Physical Exam: Vitals:   12/26/2019 1740 01/11/2020 1741 12/19/2019 1754 01/02/2020 1941  BP:    (!) 133/101  Pulse: (!) 133 (!) 101  93  Resp: (!) 23 (!) 24 (!) 25 17  Temp:      TempSrc:      SpO2:    98%  Weight:      Height:          Constitutional: Confused, no distress Vitals:   01/06/2020 1740 12/22/2019 1741 12/22/2019 1754 01/09/2020 1941  BP:    (!) 133/101  Pulse: (!) 133 (!) 101  93  Resp: (!) 23 (!) 24 (!) 25 17  Temp:      TempSrc:      SpO2:    98%  Weight:      Height:       Eyes: PERRL, lids and conjunctivae normal ENMT: Mucous membranes are dry. Posterior pharynx clear of any exudate or lesions.Normal dentition.  Neck: normal, supple,  no masses, no thyromegaly Respiratory: clear to auscultation bilaterally, no wheezing, no crackles. Normal respiratory effort. No accessory muscle use.  Cardiovascular: Sinus tachycardia, no murmurs / rubs / gallops. No extremity edema. 2+ pedal pulses. No carotid bruits.  Abdomen: no tenderness, no masses palpated. No hepatosplenomegaly. Bowel sounds positive.  Musculoskeletal: no clubbing / cyanosis. No joint deformity upper and lower extremities. Good ROM, no contractures. Normal muscle tone.  Skin: no rashes, lesions, ulcers. No induration Neurologic: CN 2-12 grossly intact. Sensation intact, DTR normal. Strength 5/5 in all 4.  Psychiatric: Confused, communicating, not a good historian   Labs on Admission: I have  personally reviewed following labs and imaging studies  CBC: Recent Labs  Lab 01/09/2020 1558  WBC 5.9  NEUTROABS 4.6  HGB 12.6  HCT 36.3  MCV 93.8  PLT 740   Basic Metabolic Panel: Recent Labs  Lab 12/17/2019 1558  NA 136  K 3.5  CL 107  CO2 22  GLUCOSE 144*  BUN 14  CREATININE 0.59  CALCIUM 7.9*   GFR: Estimated Creatinine Clearance: 38.5 mL/min (by C-G formula based on SCr of 0.59 mg/dL). Liver Function Tests: Recent Labs  Lab 01/06/2020 1558  AST 36  ALT 21  ALKPHOS 50  BILITOT 1.2  PROT 5.9*  ALBUMIN 3.3*   No results for input(s): LIPASE, AMYLASE in the last 168 hours. No results for input(s): AMMONIA in the last 168 hours. Coagulation Profile: No results for input(s): INR, PROTIME in the last 168 hours. Cardiac Enzymes: No results for input(s): CKTOTAL, CKMB, CKMBINDEX, TROPONINI in the last 168 hours. BNP (last 3 results) No results for input(s): PROBNP in the last 8760 hours. HbA1C: No results for input(s): HGBA1C in the last 72 hours. CBG: No results for input(s): GLUCAP in the last 168 hours. Lipid Profile: No results for input(s): CHOL, HDL, LDLCALC, TRIG, CHOLHDL, LDLDIRECT in the last 72 hours. Thyroid Function Tests: No results for input(s): TSH, T4TOTAL, FREET4, T3FREE, THYROIDAB in the last 72 hours. Anemia Panel: No results for input(s): VITAMINB12, FOLATE, FERRITIN, TIBC, IRON, RETICCTPCT in the last 72 hours. Urine analysis:    Component Value Date/Time   COLORURINE YELLOW (A) 01/02/2020 1558   APPEARANCEUR HAZY (A) 12/19/2019 1558   LABSPEC 1.016 12/14/2019 1558   PHURINE 6.0 12/22/2019 1558   GLUCOSEU NEGATIVE 12/19/2019 1558   HGBUR SMALL (A) 01/11/2020 1558   BILIRUBINUR NEGATIVE 01/11/2020 1558   KETONESUR 5 (A) 01/12/2020 1558   PROTEINUR 30 (A) 12/21/2019 1558   NITRITE POSITIVE (A) 01/09/2020 1558   LEUKOCYTESUR SMALL (A) 01/11/2020 1558   Sepsis Labs: @LABRCNTIP (procalcitonin:4,lacticidven:4) )No results found for this  or any previous visit (from the past 240 hour(s)).   Radiological Exams on Admission: CT Head Wo Contrast  Result Date: 12/31/2019 CLINICAL DATA:  Encephalopathy. EXAM: CT HEAD WITHOUT CONTRAST TECHNIQUE: Contiguous axial images were obtained from the base of the skull through the vertex without intravenous contrast. COMPARISON:  01/29/2015 FINDINGS: Brain: Ventricles and cisterns are unchanged. Evidence of age related atrophic change. Chronic ischemic microvascular disease is present. There is no mass, mass effect, shift of midline structures or acute hemorrhage. No evidence of acute hemorrhage. Vascular: No hyperdense vessel or unexpected calcification. Skull: No acute fracture. Sinuses/Orbits: Orbits are normal. Paranasal sinuses are well developed and aerated. Other: None. IMPRESSION: 1.  No acute findings. 2.  Age related atrophy and chronic ischemic microvascular disease. Electronically Signed   By: Marin Olp M.D.   On: 12/31/2019 17:28   DG Chest [facility]  1 View  Result Date: 12/21/2019 CLINICAL DATA:  Altered mental status. EXAM: PORTABLE CHEST 1 VIEW COMPARISON:  None. FINDINGS: Cardiomegaly. Mild interstitial prominence suggesting mild edema. Cardiomegaly. The hila and mediastinum are unchanged. No pneumothorax. IMPRESSION: Cardiomegaly and mild edema. Electronically Signed   By: Dorise Bullion III M.D   On: 01/05/2020 16:11   CT Renal Stone Study  Result Date: 01/12/2020 CLINICAL DATA:  Hematuria EXAM: CT ABDOMEN AND PELVIS WITHOUT CONTRAST TECHNIQUE: Multidetector CT imaging of the abdomen and pelvis was performed following the standard protocol without IV contrast. COMPARISON:  None. FINDINGS: Lower chest: Bibasilar subsegmental atelectasis. Coronary artery calcification. Calcified granuloma within the right middle lobe. Hepatobiliary: Numerous scattered punctate calcifications throughout the liver suggests sequela of chronic granulomatous disease. No focal liver lesion is identified.  Gallbladder not identified, likely surgically absent. Pancreas: Pancreatic atrophy. No ductal dilatation or inflammatory changes. Spleen: Numerous splenic calcifications compatible with chronic granulomatous disease. Otherwise unremarkable. Adrenals/Urinary Tract: Unremarkable adrenal glands. Kidneys have an unremarkable noncontrast appearance. No renal stone. Mild bilateral hydroureteronephrosis. No ureteral calculi identified. Urinary bladder is mildly distended but appears otherwise unremarkable. Stomach/Bowel: Small hiatal hernia. No dilated loops of bowel. Extensive sigmoid diverticulosis. No pericolonic inflammatory changes. Vascular/Lymphatic: Scattered aortoiliac atherosclerotic calcifications without aneurysm. No abdominopelvic lymphadenopathy. Reproductive: Large bilateral adnexal cystic lesions. Left adnexal cystic lesion measures 9.0 x 8.2 x 8.8 cm. Dominant right adnexal cystic lesion measures 7.5 x 6.3 x 5.8 cm which appears slightly multiloculated. Surgically absent uterus. Other: No free fluid.  No free air.  No abdominal wall hernia. Musculoskeletal: Degenerative lumbar spondylosis. No acute or significant osseous findings. IMPRESSION: 1. Large bilateral adnexal cystic lesions, measuring up to 8.8 cm on the left and 7.5 cm on the right. Findings are concerning for ovarian cystadenomas. Further evaluation with pelvic ultrasound can be performed as clinically indicated. 2. Distention of the urinary bladder with mild bilateral hydroureteronephrosis. No urinary tract calcification identified. Correlate for urinary retention. 3. Extensive sigmoid diverticulosis without evidence of acute diverticulitis. 4. Small hiatal hernia. 5. Aortic atherosclerosis. (ICD10-I70.0). Electronically Signed   By: Davina Poke D.O.   On: 01/11/2020 17:41      Assessment/Plan Principal Problem:   UTI (urinary tract infection) Active Problems:   Pulmonary embolism (HCC)   S/P IVC filter   AMS (altered mental  status)     #1 metabolic encephalopathy: Secondary to UTI in the elderly.  Patient will be admitted.  IV antibiotics.  Obtain blood and urine culture and wait for results.  Depend on the results we will adjust medications accordingly.  #2 UTI: Most likely acute cystitis.  Empirically start on Rocephin.  Await for urine and blood culture results to adjust antibiotics.  #3 history of DVT and PE: Patient has IVC filter in place.Marland Kitchen  Prophylactic Lovenox.  #4 ovarian cystadenomas: Incidental findings on CT abdomen and pelvis.  We will continue close monitoring  #5 malignant hypertension: Initially presented with blood pressure more than 200/100.  Currently improving.  Resume home regimen and monitor   DVT prophylaxis: Heparin Code Status: Full code Family Communication: No family at bedside Disposition Plan: To be determined Consults called: None Admission status: Inpatient  Severity of Illness: The appropriate patient status for this patient is INPATIENT. Inpatient status is judged to be reasonable and necessary in order to provide the required intensity of service to ensure the patient's safety. The patient's presenting symptoms, physical exam findings, and initial radiographic and laboratory data in the context of their chronic comorbidities is felt  to place them at high risk for further clinical deterioration. Furthermore, it is not anticipated that the patient will be medically stable for discharge from the hospital within 2 midnights of admission. The following factors support the patient status of inpatient.   " The patient's presenting symptoms include confusion. " The worrisome physical exam findings include altered mental status. " The initial radiographic and laboratory data are worrisome because of evidence of UTI. " The chronic co-morbidities include recurring UTIs and hypertension.   * I certify that at the point of admission it is my clinical judgment that the patient will  require inpatient hospital care spanning beyond 2 midnights from the point of admission due to high intensity of service, high risk for further deterioration and high frequency of surveillance required.Barbette Merino MD [facility] Pager [phone]  If 7PM-7AM, please contact night-coverage [email or web address] Password [facility]  12/31/2019, 8:00 PM

## 2020-01-01 NOTE — ED Notes (Addendum)
Pt pulled out L ac IV per ED tech. Site bandaged

## 2020-01-02 DIAGNOSIS — G9341 Metabolic encephalopathy: Secondary | ICD-10-CM

## 2020-01-02 LAB — COMPREHENSIVE METABOLIC PANEL
ALT: 26 U/L (ref 0–44)
AST: 43 U/L — ABNORMAL HIGH (ref 15–41)
Albumin: 3.4 g/dL — ABNORMAL LOW (ref 3.5–5.0)
Alkaline Phosphatase: 52 U/L (ref 38–126)
Anion gap: 9 (ref 5–15)
BUN: 10 mg/dL (ref 8–23)
CO2: 23 mmol/L (ref 22–32)
Calcium: 8 mg/dL — ABNORMAL LOW (ref 8.9–10.3)
Chloride: 99 mmol/L (ref 98–111)
Creatinine, Ser: 0.66 mg/dL (ref 0.44–1.00)
GFR calc Af Amer: >60 mL/min (ref >60)
GFR calc non Af Amer: >60 mL/min (ref >60)
Glucose, Bld: 165 mg/dL — ABNORMAL HIGH (ref 70–99)
Potassium: 3.2 mmol/L — ABNORMAL LOW (ref 3.5–5.1)
Sodium: 131 mmol/L — ABNORMAL LOW (ref 135–145)
Total Bilirubin: 0.9 mg/dL (ref 0.3–1.2)
Total Protein: 6.2 g/dL — ABNORMAL LOW (ref 6.5–8.1)

## 2020-01-02 LAB — CBC
HCT: 39.7 % (ref 36.0–46.0)
Hemoglobin: 13.3 g/dL (ref 12.0–15.0)
MCH: 31.8 pg (ref 26.0–34.0)
MCHC: 33.5 g/dL (ref 30.0–36.0)
MCV: 95 fL (ref 80.0–100.0)
Platelets: 156 10*3/uL (ref 150–400)
RBC: 4.18 MIL/uL (ref 3.87–5.11)
RDW: 14.3 % (ref 11.5–15.5)
WBC: 8.2 10*3/uL (ref 4.0–10.5)
nRBC: 0 % (ref 0.0–0.2)

## 2020-01-02 MED ORDER — POTASSIUM CHLORIDE IN NACL 40-0.9 MEQ/L-% IV SOLN
INTRAVENOUS | Status: DC
  Filled 2020-01-02 (×5): qty 1000

## 2020-01-02 MED ORDER — HALOPERIDOL LACTATE 5 MG/ML IJ SOLN
1.0000 mg | Freq: Four times a day (QID) | INTRAMUSCULAR | Status: DC | PRN
  Administered 2020-01-02: 1 mg via INTRAVENOUS
  Administered 2020-01-03: 2 mg via INTRAVENOUS
  Filled 2020-01-02 (×2): qty 1

## 2020-01-02 MED ORDER — HALOPERIDOL LACTATE 5 MG/ML IJ SOLN
2.0000 mg | Freq: Four times a day (QID) | INTRAMUSCULAR | Status: DC | PRN
  Administered 2020-01-02: 02:00:00 2 mg via INTRAVENOUS
  Filled 2020-01-02: qty 1

## 2020-01-02 MED ORDER — HYDRALAZINE HCL 20 MG/ML IJ SOLN
5.0000 mg | INTRAMUSCULAR | Status: DC | PRN
  Administered 2020-01-02: 5 mg via INTRAVENOUS
  Filled 2020-01-02: qty 1

## 2020-01-02 NOTE — Progress Notes (Signed)
Cross Cover Brief Note Patient with increased agitation and confusion.  Multiple attempts to get out of bed and continuous redirection needed for safety.  No ability to obtained physical or tele sitter at this time.  Haldol oredered for increased agitiaton

## 2020-01-02 NOTE — Progress Notes (Signed)
Patient ID: Courtney Simon, female   DOB: 1922/06/20, 86 y.o.   MRN: [id number]  PROGRESS NOTE    ZULEMA PULASKI  QQP:[id number] DOB: 05-23-1922 DOA: 01/02/2020 PCP: Baxter Hire, MD   Brief Narrative:  86 y.o. female with medical history significant of recurrent DVTs, osteoarthritis, peripheral vascular disease, history of IVC filter who presented to the ER via EMS with confusion.  In the ED, UA was consistent with UTI.  She was started on IV antibiotics.  Assessment & Plan:   Acute metabolic encephalopathy Intermittent agitation -Probably from UTI.  Monitor mental status.  CT head was negative for acute intracranial abnormality -Patient was agitated overnight and had to be given Haldol. -Fall precautions.  Monitor mental status. -SLP evaluation  UTI/acute cystitis -Continue Rocephin.  Follow cultures. -CT renal stone study shows mild bilateral hydroureteronephrosis with bladder distention  Hyponatremia -Continue IV fluids.  Hypokalemia -Replace.  Repeat a.m. labs  History of DVT and PE -History of IVC filter in place  Probable ovarian cystadenomas -Incidental findings on CT renal stone study.  Outpatient follow-up  Malignant hypertension -May also be contributing to altered mental status/encephalopathy -Blood pressure improving.  Monitor.  Generalized conditioning -Overall prognosis is guarded to poor.  Spoke to husband at bedside who verified that patient is a DNR. -palliative care evaluation - will need PT eval at some point   DVT prophylaxis: Heparin Code Status: DNR: Confirmed by the husband at bedside  family Communication: Husband at bedside  disposition Plan: Status is: Inpatient  Remains inpatient appropriate because:Altered mental status   Dispo: The patient is from: Home              Anticipated d/c is to: SNF              Anticipated d/c date is: 2 days              Patient currently is not medically stable to d/c.   Consultants:  None  Procedures: None  Antimicrobials: Rocephin from 12/16/2019 onwards  Subjective: Patient seen and examined at bedside.  She is awake but confused.  Staff reports overnight agitation and required Haldol.  No overnight fever or vomiting.  Objective: Vitals:   12/20/2019 2020 12/23/2019 2151 01/02/2020 2351 01/02/20 0333  BP: (!) 169/81 (!) 160/97 (!) 176/70 (!) 177/78  Pulse: 93 94 84 93  Resp: 15 16 16 16   Temp: 99.3 F (37.4 C) 98.6 F (37 C) 100.1 F (37.8 C) 98.4 F (36.9 C)  TempSrc: Oral Oral Oral Axillary  SpO2: 95% 94% 96% 95%  Weight:      Height:        Intake/Output Summary (Last 24 hours) at 01/02/2020 0800 Last data filed at 01/02/2020 0400 Gross per 24 hour  Intake 1550.58 ml  Output 650 ml  Net 900.58 ml   Filed Weights   12/30/2019 1540  Weight: 67 kg    Examination:  General exam: Awake, hardly answers any questions.  Elderly female lying in bed. Respiratory system: Bilateral decreased breath sounds at bases Cardiovascular system: S1 & S2 heard, Rate controlled Gastrointestinal system: Abdomen is nondistended, soft and nontender. Normal bowel sounds heard. Extremities: No cyanosis, clubbing; trace lower extremity edema Central nervous system: Drowsy. No focal neurological deficits. Moving extremities Skin: No rashes, lesions or ulcers Psychiatry: Could not be assessed because of mental status    Data Reviewed: I have personally reviewed following labs and imaging studies  CBC: Recent Labs  Lab 01/10/2020 1558 01/02/20  0407  WBC 5.9 8.2  NEUTROABS 4.6  --   HGB 12.6 13.3  HCT 36.3 39.7  MCV 93.8 95.0  PLT 164 397   Basic Metabolic Panel: Recent Labs  Lab 01/08/2020 1558 01/02/20 0407  NA 136 131*  K 3.5 3.2*  CL 107 99  CO2 22 23  GLUCOSE 144* 165*  BUN 14 10  CREATININE 0.59 0.66  CALCIUM 7.9* 8.0*   GFR: Estimated Creatinine Clearance: 38.5 mL/min (by C-G formula based on SCr of 0.66 mg/dL). Liver Function Tests: Recent Labs   Lab 12/28/2019 1558 01/02/20 0407  AST 36 43*  ALT 21 26  ALKPHOS 50 52  BILITOT 1.2 0.9  PROT 5.9* 6.2*  ALBUMIN 3.3* 3.4*   No results for input(s): LIPASE, AMYLASE in the last 168 hours. No results for input(s): AMMONIA in the last 168 hours. Coagulation Profile: Recent Labs  Lab 12/22/2019 2016  INR 1.2   Cardiac Enzymes: No results for input(s): CKTOTAL, CKMB, CKMBINDEX, TROPONINI in the last 168 hours. BNP (last 3 results) No results for input(s): PROBNP in the last 8760 hours. HbA1C: No results for input(s): HGBA1C in the last 72 hours. CBG: No results for input(s): GLUCAP in the last 168 hours. Lipid Profile: No results for input(s): CHOL, HDL, LDLCALC, TRIG, CHOLHDL, LDLDIRECT in the last 72 hours. Thyroid Function Tests: No results for input(s): TSH, T4TOTAL, FREET4, T3FREE, THYROIDAB in the last 72 hours. Anemia Panel: No results for input(s): VITAMINB12, FOLATE, FERRITIN, TIBC, IRON, RETICCTPCT in the last 72 hours. Sepsis Labs: Recent Labs  Lab 01/05/2020 1558  LATICACIDVEN 1.0    Recent Results (from the past 240 hour(s))  Culture, blood (Routine x 2)     Status: None (Preliminary result)   Collection Time: 01/02/2020  3:59 PM   Specimen: BLOOD  Result Value Ref Range Status   Specimen Description BLOOD BLOOD RIGHT FOREARM  Final   Special Requests   Final    BOTTLES DRAWN AEROBIC AND ANAEROBIC Blood Culture results may not be optimal due to an excessive volume of blood received in culture bottles   Culture   Final    NO GROWTH < 24 HOURS Performed at [facility], [address], [place], [place] [address]    Report Status PENDING  Incomplete  Culture, blood (Routine x 2)     Status: None (Preliminary result)   Collection Time: 01/08/2020  3:59 PM   Specimen: BLOOD  Result Value Ref Range Status   Specimen Description BLOOD LEFT ANTECUBITAL  Final   Special Requests   Final    BOTTLES DRAWN AEROBIC AND ANAEROBIC Blood Culture adequate volume    Culture   Final    NO GROWTH < 24 HOURS Performed at [facility], [address] [place], [place] [address]    Report Status PENDING  Incomplete  SARS Coronavirus 2 by RT PCR (hospital order, performed in [place] hospital lab) Nasopharyngeal Nasopharyngeal Swab     Status: None   Collection Time: 12/26/2019  8:16 PM   Specimen: Nasopharyngeal Swab  Result Value Ref Range Status   SARS Coronavirus 2 NEGATIVE NEGATIVE Final    Comment: (NOTE) SARS-CoV-2 target nucleic acids are NOT DETECTED.  The SARS-CoV-2 RNA is generally detectable in upper and lower respiratory specimens during the acute phase of infection. The lowest concentration of SARS-CoV-2 viral copies this assay can detect is 250 copies / mL. A negative result does not preclude SARS-CoV-2 infection and should not be used as  the sole basis for treatment or other patient management decisions.  A negative result may occur with improper specimen collection / handling, submission of specimen other than nasopharyngeal swab, presence of viral mutation(s) within the areas targeted by this assay, and inadequate number of viral copies (<250 copies / mL). A negative result must be combined with clinical observations, patient history, and epidemiological information.  Fact Sheet for Patients:   StrictlyIdeas.no  Fact Sheet for Healthcare Providers: BankingDealers.co.za  This test is not yet approved or  cleared by the [place] FDA and has been authorized for detection and/or diagnosis of SARS-CoV-2 by FDA under an Emergency Use Authorization (EUA).  This EUA will remain in effect (meaning this test can be used) for the duration of the COVID-19 declaration under Section 564(b)(1) of the Act, 21 U.S.C. section 360bbb-3(b)(1), unless the authorization is terminated or revoked sooner.  Performed at [facility], [address] [place], [place] [address]           Radiology Studies: CT Head Wo Contrast  Result Date: 12/31/2019 CLINICAL DATA:  Encephalopathy. EXAM: CT HEAD WITHOUT CONTRAST TECHNIQUE: Contiguous axial images were obtained from the base of the skull through the vertex without intravenous contrast. COMPARISON:  01/29/2015 FINDINGS: Brain: Ventricles and cisterns are unchanged. Evidence of age related atrophic change. Chronic ischemic microvascular disease is present. There is no mass, mass effect, shift of midline structures or acute hemorrhage. No evidence of acute hemorrhage. Vascular: No hyperdense vessel or unexpected calcification. Skull: No acute fracture. Sinuses/Orbits: Orbits are normal. Paranasal sinuses are well developed and aerated. Other: None. IMPRESSION: 1.  No acute findings. 2.  Age related atrophy and chronic ischemic microvascular disease. Electronically Signed   By: Marin Olp M.D.   On: 12/30/2019 17:28   DG Chest Port 1 View  Result Date: 01/02/2020 CLINICAL DATA:  Altered mental status. EXAM: PORTABLE CHEST 1 VIEW COMPARISON:  None. FINDINGS: Cardiomegaly. Mild interstitial prominence suggesting mild edema. Cardiomegaly. The hila and mediastinum are unchanged. No pneumothorax. IMPRESSION: Cardiomegaly and mild edema. Electronically Signed   By: Dorise Bullion III M.D   On: 12/14/2019 16:11   CT Renal Stone Study  Result Date: 01/12/2020 CLINICAL DATA:  Hematuria EXAM: CT ABDOMEN AND PELVIS WITHOUT CONTRAST TECHNIQUE: Multidetector CT imaging of the abdomen and pelvis was performed following the standard protocol without IV contrast. COMPARISON:  None. FINDINGS: Lower chest: Bibasilar subsegmental atelectasis. Coronary artery calcification. Calcified granuloma within the right middle lobe. Hepatobiliary: Numerous scattered punctate calcifications throughout the liver suggests sequela of chronic granulomatous disease. No focal liver lesion is identified. Gallbladder not identified, likely surgically absent.  Pancreas: Pancreatic atrophy. No ductal dilatation or inflammatory changes. Spleen: Numerous splenic calcifications compatible with chronic granulomatous disease. Otherwise unremarkable. Adrenals/Urinary Tract: Unremarkable adrenal glands. Kidneys have an unremarkable noncontrast appearance. No renal stone. Mild bilateral hydroureteronephrosis. No ureteral calculi identified. Urinary bladder is mildly distended but appears otherwise unremarkable. Stomach/Bowel: Small hiatal hernia. No dilated loops of bowel. Extensive sigmoid diverticulosis. No pericolonic inflammatory changes. Vascular/Lymphatic: Scattered aortoiliac atherosclerotic calcifications without aneurysm. No abdominopelvic lymphadenopathy. Reproductive: Large bilateral adnexal cystic lesions. Left adnexal cystic lesion measures 9.0 x 8.2 x 8.8 cm. Dominant right adnexal cystic lesion measures 7.5 x 6.3 x 5.8 cm which appears slightly multiloculated. Surgically absent uterus. Other: No free fluid.  No free air.  No abdominal wall hernia. Musculoskeletal: Degenerative lumbar spondylosis. No acute or significant osseous findings. IMPRESSION: 1. Large bilateral adnexal cystic lesions, measuring up to 8.8 cm on the  left and 7.5 cm on the right. Findings are concerning for ovarian cystadenomas. Further evaluation with pelvic ultrasound can be performed as clinically indicated. 2. Distention of the urinary bladder with mild bilateral hydroureteronephrosis. No urinary tract calcification identified. Correlate for urinary retention. 3. Extensive sigmoid diverticulosis without evidence of acute diverticulitis. 4. Small hiatal hernia. 5. Aortic atherosclerosis. (ICD10-I70.0). Electronically Signed   By: Davina Poke D.O.   On: 12/19/2019 17:41        Scheduled Meds: . heparin  5,000 Units Subcutaneous Q8H   Continuous Infusions: . sodium chloride 75 mL/hr at 12/24/2019 2247  . cefTRIAXone (ROCEPHIN)  IV 1 g (01/10/2020 2248)          Aline August, MD [facility] 01/02/2020, 8:00 AM

## 2020-01-03 LAB — CBC WITH DIFFERENTIAL/PLATELET
Abs Immature Granulocytes: 0.1 10*3/uL — ABNORMAL HIGH (ref 0.00–0.07)
Basophils Absolute: 0 10*3/uL (ref 0.0–0.1)
Basophils Relative: 0 %
Eosinophils Absolute: 0 10*3/uL (ref 0.0–0.5)
Eosinophils Relative: 0 %
HCT: 41.3 % (ref 36.0–46.0)
Hemoglobin: 14.9 g/dL (ref 12.0–15.0)
Immature Granulocytes: 1 %
Lymphocytes Relative: 6 %
Lymphs Abs: 0.6 10*3/uL — ABNORMAL LOW (ref 0.7–4.0)
MCH: 32.7 pg (ref 26.0–34.0)
MCHC: 36.1 g/dL — ABNORMAL HIGH (ref 30.0–36.0)
MCV: 90.6 fL (ref 80.0–100.0)
Monocytes Absolute: 0.7 10*3/uL (ref 0.1–1.0)
Monocytes Relative: 6 %
Neutro Abs: 9.3 10*3/uL — ABNORMAL HIGH (ref 1.7–7.7)
Neutrophils Relative %: 87 %
Platelets: 202 10*3/uL (ref 150–400)
RBC: 4.56 MIL/uL (ref 3.87–5.11)
RDW: 14 % (ref 11.5–15.5)
WBC: 10.8 10*3/uL — ABNORMAL HIGH (ref 4.0–10.5)
nRBC: 0 % (ref 0.0–0.2)

## 2020-01-03 LAB — COMPREHENSIVE METABOLIC PANEL
ALT: 26 U/L (ref 0–44)
AST: 38 U/L (ref 15–41)
Albumin: 3.6 g/dL (ref 3.5–5.0)
Alkaline Phosphatase: 66 U/L (ref 38–126)
Anion gap: 15 (ref 5–15)
BUN: 11 mg/dL (ref 8–23)
CO2: 18 mmol/L — ABNORMAL LOW (ref 22–32)
Calcium: 8.5 mg/dL — ABNORMAL LOW (ref 8.9–10.3)
Chloride: 97 mmol/L — ABNORMAL LOW (ref 98–111)
Creatinine, Ser: 0.58 mg/dL (ref 0.44–1.00)
GFR calc Af Amer: >60 mL/min (ref >60)
GFR calc non Af Amer: >60 mL/min (ref >60)
Glucose, Bld: 249 mg/dL — ABNORMAL HIGH (ref 70–99)
Potassium: 3.9 mmol/L (ref 3.5–5.1)
Sodium: 130 mmol/L — ABNORMAL LOW (ref 135–145)
Total Bilirubin: 1.4 mg/dL — ABNORMAL HIGH (ref 0.3–1.2)
Total Protein: 6.9 g/dL (ref 6.5–8.1)

## 2020-01-03 LAB — MAGNESIUM: Magnesium: 1.7 mg/dL (ref 1.7–2.4)

## 2020-01-03 LAB — TSH: TSH: 1.094 u[IU]/mL (ref 0.350–4.500)

## 2020-01-03 LAB — AMMONIA: Ammonia: 20 umol/L (ref 9–35)

## 2020-01-03 LAB — FOLATE: Folate: 17.6 ng/mL (ref >5.9)

## 2020-01-03 LAB — VITAMIN B12: Vitamin B-12: <50 pg/mL — ABNORMAL LOW (ref 180–914)

## 2020-01-03 MED ORDER — LORAZEPAM 2 MG/ML IJ SOLN: 0.5000 mg | INTRAMUSCULAR | Status: DC | PRN

## 2020-01-06 LAB — CULTURE, BLOOD (ROUTINE X 2)
Culture: NO GROWTH
Culture: NO GROWTH
Special Requests: ADEQUATE

## 2020-01-13 NOTE — Death Summary Note (Signed)
Death Summary  Courtney Simon QQI:[id number] DOB: Dec 30, 1921 DOA: 01-16-20  PCP: Baxter Hire, MD  Admit date: 16-Jan-2020 Date of Death: 18-Jan-2020 Time of Death: 10:20 AM   History of present illness:  86 y.o.femalewith medical history significant ofrecurrent DVTs, osteoarthritis, peripheral vascular disease, history of IVC filter who presented to the ER via EMS with confusion.  In the ED, UA was consistent with UTI.  She was started on IV antibiotics.  During the hospitalization, her condition did not improve.  She passed away on 18-Jan-2020 at 10:20 AM.  Family was at bedside.  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    Significant Diagnostic Studies: CT Head Wo Contrast  Result Date: 01-16-2020 CLINICAL DATA:  Encephalopathy. EXAM: CT HEAD WITHOUT CONTRAST TECHNIQUE: Contiguous axial images were obtained from the base of the skull through the vertex without intravenous contrast. COMPARISON:  01/29/2015 FINDINGS: Brain: Ventricles and cisterns are unchanged. Evidence of age related atrophic change. Chronic ischemic microvascular disease is present. There is no mass, mass effect, shift of midline structures or acute hemorrhage. No evidence of acute hemorrhage. Vascular: No hyperdense vessel or unexpected calcification. Skull: No acute fracture. Sinuses/Orbits: Orbits are normal. Paranasal sinuses are well developed and aerated. Other: None. IMPRESSION: 1.  No acute findings. 2.  Age related atrophy and chronic ischemic microvascular disease. Electronically Signed   By: Marin Olp M.D.   On: 01-16-2020 17:28   DG Chest Port 1 View  Result  Date: Jan 16, 2020 CLINICAL DATA:  Altered mental status. EXAM: PORTABLE CHEST 1 VIEW COMPARISON:  None. FINDINGS: Cardiomegaly. Mild interstitial prominence suggesting mild edema. Cardiomegaly. The hila and mediastinum are unchanged. No pneumothorax. IMPRESSION: Cardiomegaly and mild edema. Electronically Signed   By: Dorise Bullion III M.D   On: January 16, 2020 16:11   CT Renal Stone Study  Result Date: 01-16-20 CLINICAL DATA:  Hematuria EXAM: CT ABDOMEN AND PELVIS WITHOUT CONTRAST TECHNIQUE: Multidetector CT imaging of the abdomen and pelvis was performed following the standard protocol without IV contrast. COMPARISON:  None. FINDINGS: Lower chest: Bibasilar subsegmental atelectasis. Coronary artery calcification. Calcified granuloma within the right middle lobe. Hepatobiliary: Numerous scattered punctate calcifications throughout the liver suggests sequela of chronic granulomatous disease. No focal liver lesion is identified. Gallbladder not identified, likely surgically absent. Pancreas: Pancreatic atrophy. No ductal dilatation or inflammatory changes. Spleen: Numerous splenic calcifications compatible with chronic granulomatous disease. Otherwise unremarkable. Adrenals/Urinary Tract: Unremarkable adrenal glands. Kidneys have an unremarkable noncontrast appearance. No renal stone. Mild bilateral hydroureteronephrosis. No ureteral calculi identified. Urinary bladder is mildly distended but appears otherwise unremarkable. Stomach/Bowel: Small hiatal hernia. No dilated loops of bowel. Extensive sigmoid diverticulosis. No pericolonic inflammatory changes. Vascular/Lymphatic: Scattered aortoiliac atherosclerotic calcifications without aneurysm. No abdominopelvic lymphadenopathy. Reproductive: Large bilateral adnexal cystic lesions. Left adnexal cystic lesion measures 9.0 x 8.2 x 8.8 cm. Dominant right adnexal cystic lesion measures 7.5 x 6.3 x 5.8 cm which appears slightly multiloculated. Surgically absent uterus.  Other: No free fluid.  No free air.  No abdominal wall hernia. Musculoskeletal: Degenerative lumbar spondylosis. No acute or significant osseous findings. IMPRESSION: 1. Large bilateral adnexal cystic lesions, measuring up to 8.8 cm on the left and 7.5 cm on the right. Findings are concerning for ovarian cystadenomas. Further evaluation with pelvic ultrasound can be performed  as clinically indicated. 2. Distention of the urinary bladder with mild bilateral hydroureteronephrosis. No urinary tract calcification identified. Correlate for urinary retention. 3. Extensive sigmoid diverticulosis without evidence of acute diverticulitis. 4. Small hiatal hernia. 5. Aortic atherosclerosis. (ICD10-I70.0). Electronically Signed   By: Davina Poke D.O.   On: 12/17/2019 17:41    Microbiology: Recent Results (from the past 240 hour(s))  Culture, blood (Routine x 2)     Status: None (Preliminary result)   Collection Time: 01/02/2020  3:59 PM   Specimen: BLOOD  Result Value Ref Range Status   Specimen Description BLOOD BLOOD RIGHT FOREARM  Final   Special Requests   Final    BOTTLES DRAWN AEROBIC AND ANAEROBIC Blood Culture results may not be optimal due to an excessive volume of blood received in culture bottles   Culture   Final    NO GROWTH 2 DAYS Performed at [facility], [address], [place], [place] [address]    Report Status PENDING  Incomplete  Culture, blood (Routine x 2)     Status: None (Preliminary result)   Collection Time: 01/07/2020  3:59 PM   Specimen: BLOOD  Result Value Ref Range Status   Specimen Description BLOOD LEFT ANTECUBITAL  Final   Special Requests   Final    BOTTLES DRAWN AEROBIC AND ANAEROBIC Blood Culture adequate volume   Culture   Final    NO GROWTH 2 DAYS Performed at [facility], [address], [place], [place] [address]    Report Status PENDING  Incomplete  SARS Coronavirus 2 by RT PCR (hospital order, performed in [place] hospital lab)  Nasopharyngeal Nasopharyngeal Swab     Status: None   Collection Time: 01/07/2020  8:16 PM   Specimen: Nasopharyngeal Swab  Result Value Ref Range Status   SARS Coronavirus 2 NEGATIVE NEGATIVE Final    Comment: (NOTE) SARS-CoV-2 target nucleic acids are NOT DETECTED.  The SARS-CoV-2 RNA is generally detectable in upper and lower respiratory specimens during the acute phase of infection. The lowest concentration of SARS-CoV-2 viral copies this assay can detect is 250 copies / mL. A negative result does not preclude SARS-CoV-2 infection and should not be used as the sole basis for treatment or other patient management decisions.  A negative result may occur with improper specimen collection / handling, submission of specimen other than nasopharyngeal swab, presence of viral mutation(s) within the areas targeted by this assay, and inadequate number of viral copies (<250 copies / mL). A negative result must be combined with clinical observations, patient history, and epidemiological information.  Fact Sheet for Patients:   StrictlyIdeas.no  Fact Sheet for Healthcare Providers: BankingDealers.co.za  This test is not yet approved or  cleared by the [place] FDA and has been authorized for detection and/or diagnosis of SARS-CoV-2 by FDA under an Emergency Use Authorization (EUA).  This EUA will remain in effect (meaning this test can be used) for the duration of the COVID-19 declaration under Section 564(b)(1) of the Act, 21 U.S.C. section 360bbb-3(b)(1), unless the authorization is terminated or revoked sooner.  Performed at [facility], [address] [place], [place] [address]      Labs: Basic Metabolic Panel: Recent Labs  Lab 12/16/2019 1558 01/11/2020 1558 01/02/20 0407 2020-01-16 0441  NA 136  --  131* 130*  K 3.5   < > 3.2* 3.9  CL 107  --  99 97*  CO2 22  --  23 18*  GLUCOSE 144*  --  165* 249*  BUN 14  --  10  11  CREATININE 0.59  --  0.66 0.58  CALCIUM 7.9*  --  8.0* 8.5*  MG  --   --   --  1.7   < > = values in this interval not displayed.   Liver Function Tests: Recent Labs  Lab 12/19/2019 1558 01/02/20 0407 01-21-20 0441  AST 36 43* 38  ALT 21 26 26   ALKPHOS 50 52 66  BILITOT 1.2 0.9 1.4*  PROT 5.9* 6.2* 6.9  ALBUMIN 3.3* 3.4* 3.6   No results for input(s): LIPASE, AMYLASE in the last 168 hours. Recent Labs  Lab Jan 21, 2020 0441  AMMONIA 20   CBC: Recent Labs  Lab 12/16/2019 1558 01/02/20 0407 01-21-2020 0441  WBC 5.9 8.2 10.8*  NEUTROABS 4.6  --  9.3*  HGB 12.6 13.3 14.9  HCT 36.3 39.7 41.3  MCV 93.8 95.0 90.6  PLT 164 156 202   Cardiac Enzymes: No results for input(s): CKTOTAL, CKMB, CKMBINDEX, TROPONINI in the last 168 hours. D-Dimer No results for input(s): DDIMER in the last 72 hours. BNP: Invalid input(s): POCBNP CBG: No results for input(s): GLUCAP in the last 168 hours. Anemia work up Recent Labs    01-21-20 0441  VITAMINB12 <50*  FOLATE 17.6   Urinalysis    Component Value Date/Time   COLORURINE YELLOW (A) 01/02/2020 1558   APPEARANCEUR HAZY (A) 01/04/2020 1558   LABSPEC 1.016 01/02/2020 1558   PHURINE 6.0 01/04/2020 1558   GLUCOSEU NEGATIVE 01/12/2020 1558   HGBUR SMALL (A) 01/12/2020 1558   BILIRUBINUR NEGATIVE 01/06/2020 1558   KETONESUR 5 (A) 12/14/2019 1558   PROTEINUR 30 (A) 01/07/2020 1558   NITRITE POSITIVE (A) 12/15/2019 1558   LEUKOCYTESUR SMALL (A) 01/02/2020 1558   Sepsis Labs Invalid input(s): PROCALCITONIN,  WBC,  LACTICIDVEN     SIGNED:  Aline August, MD  [facility] January 21, 2020, 1:25 PM

## 2020-01-13 NOTE — Progress Notes (Signed)
Called [employer], got recorded message, requesting I leave a message.

## 2020-01-13 NOTE — Progress Notes (Signed)
SLP Cancellation Note  Patient Details Name: Courtney Simon MRN: [id number] DOB: Jan 25, 1922   Cancelled treatment:       Reason Eval/Treat Not Completed: Fatigue/lethargy limiting ability to participate; Patient severely lethargic and unable to participate in clinical swallowing evaluation this morning. Will attempt to f/u at later time/date if/when patient's alertness increases and she is able to participate in evaluation.  Loni Beckwith, M.S. CCC-SLP Speech-Language Pathologist  Loni Beckwith 02-02-2020, 10:18 AM

## 2020-01-13 NOTE — Progress Notes (Signed)
Found patient with no respirations and no heartbeat, notified MD Aline August

## 2020-01-13 NOTE — Progress Notes (Signed)
Patient ID: Courtney Simon, female   DOB: March 03, 1922, 86 y.o.   MRN: [id number]  PROGRESS NOTE    JAKYRAH HOLLADAY  FGH:[id number] DOB: 1922-02-28 DOA: 12/21/2019 PCP: Baxter Hire, MD   Brief Narrative:  86 y.o. female with medical history significant of recurrent DVTs, osteoarthritis, peripheral vascular disease, history of IVC filter who presented to the ER via EMS with confusion.  In the ED, UA was consistent with UTI.  She was started on IV antibiotics.  Assessment & Plan:   Acute metabolic encephalopathy Intermittent agitation -Probably from UTI.  Monitor mental status.  CT head was negative for acute intracranial abnormality -Patient was agitated overnight again and had to be given Haldol. -Fall precautions.  Monitor mental status. -SLP evaluation  UTI/acute cystitis -Continue Rocephin.  Follow cultures. -CT renal stone study shows mild bilateral hydroureteronephrosis with bladder distention  Hyponatremia -Continue IV fluids.  Hypokalemia -Improved.  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  Outpatient follow-up  Malignant hypertension -May also be contributing to altered mental status/encephalopathy -Blood pressure improving.  Monitor.  Generalized conditioning -Overall prognosis is poor.  -Spoke to at husband at bedside today and explained the overall poor prognosis.  We will continue current treatment plan for another 24 hours and if no improvement, consider home hospice.  Husband is leaning towards home hospice as well.  Will not do repeat labs tomorrow. -palliative care evaluation: Care management consult for possible arrangement of home hospice  DVT prophylaxis: DC heparin in anticipation of home hospice discharge Code Status: DNR  family Communication: Husband at bedside  disposition Plan: Status is: Inpatient  Remains inpatient appropriate because:Altered mental  status   Dispo: The patient is from: Home              Anticipated d/c is to: Home with hospice              Anticipated d/c date is: 1 day              Patient currently is not medically stable to d/c.   Consultants: Will consult palliative care  Procedures: None  Antimicrobials: Rocephin from 01/07/2020 onwards  Subjective: Patient seen and examined at bedside.  She is sleepy, wakes up very slightly, hardly answers any questions, poor historian.  Nursing staff reports agitation overnight requiring Haldol.  No overnight fever or vomiting reported Objective: Vitals:   01/02/20 1235 01/02/20 1657 01/02/20 1934 01/02/20 2356  BP: (!) 167/89 (!) 189/87 (!) 186/86 (!) 166/88  Pulse: 82 92 (!) 107 91  Resp: 16 (!) 24 20 16   Temp: 98.6 F (37 C) 98.7 F (37.1 C) (!) 97.4 F (36.3 C) 98.2 F (36.8 C)  TempSrc:   Axillary Axillary  SpO2: 94% 96% 96% 96%  Weight:      Height:        Intake/Output Summary (Last 24 hours) at 29-Jan-2020 0721 Last data filed at 01/29/20 0400 Gross per 24 hour  Intake 160 ml  Output 950 ml  Net -790 ml   Filed Weights   01/02/2020 1540  Weight: 67 kg    Examination:  General exam: Very drowsy this morning.  elderly female lying in bed. Respiratory system: Bilateral decreased breath sounds at bases with some scattered crackles Cardiovascular system: S1 & S2 heard, intermittently tachycardic Gastrointestinal system: Abdomen is nondistended, soft and nontender.  Bowel sounds are heard  extremities: Trace bowel sounds  are heard; no clubbing Central nervous system: Drowsy; no focal neurological deficits. Moving extremities Skin: No rashes, lesions or ulcers Psychiatry: Cannot assess due to her mental status  Data Reviewed: I have personally reviewed following labs and imaging studies  CBC: Recent Labs  Lab 01/04/2020 1558 01/02/20 0407 01/10/2020 0441  WBC 5.9 8.2 10.8*  NEUTROABS 4.6  --  9.3*  HGB 12.6 13.3 14.9  HCT 36.3 39.7 41.3  MCV  93.8 95.0 90.6  PLT 164 156 355   Basic Metabolic Panel: Recent Labs  Lab 01/05/2020 1558 01/02/20 0407 10-Jan-2020 0441  NA 136 131* 130*  K 3.5 3.2* 3.9  CL 107 99 97*  CO2 22 23 18*  GLUCOSE 144* 165* 249*  BUN 14 10 11   CREATININE 0.59 0.66 0.58  CALCIUM 7.9* 8.0* 8.5*  MG  --   --  1.7   GFR: Estimated Creatinine Clearance: 38.5 mL/min (by C-G formula based on SCr of 0.58 mg/dL). Liver Function Tests: Recent Labs  Lab 01/02/2020 1558 01/02/20 0407 01-10-20 0441  AST 36 43* 38  ALT 21 26 26   ALKPHOS 50 52 66  BILITOT 1.2 0.9 1.4*  PROT 5.9* 6.2* 6.9  ALBUMIN 3.3* 3.4* 3.6   No results for input(s): LIPASE, AMYLASE in the last 168 hours. Recent Labs  Lab 10-Jan-2020 0441  AMMONIA 20   Coagulation Profile: Recent Labs  Lab 12/19/2019 2016  INR 1.2   Cardiac Enzymes: No results for input(s): CKTOTAL, CKMB, CKMBINDEX, TROPONINI in the last 168 hours. BNP (last 3 results) No results for input(s): PROBNP in the last 8760 hours. HbA1C: No results for input(s): HGBA1C in the last 72 hours. CBG: No results for input(s): GLUCAP in the last 168 hours. Lipid Profile: No results for input(s): CHOL, HDL, LDLCALC, TRIG, CHOLHDL, LDLDIRECT in the last 72 hours. Thyroid Function Tests: Recent Labs    01/10/20 0441  TSH 1.094   Anemia Panel: Recent Labs    Jan 10, 2020 0441  FOLATE 17.6   Sepsis Labs: Recent Labs  Lab 12/20/2019 1558  LATICACIDVEN 1.0    Recent Results (from the past 240 hour(s))  Culture, blood (Routine x 2)     Status: None (Preliminary result)   Collection Time: 01/08/2020  3:59 PM   Specimen: BLOOD  Result Value Ref Range Status   Specimen Description BLOOD BLOOD RIGHT FOREARM  Final   Special Requests   Final    BOTTLES DRAWN AEROBIC AND ANAEROBIC Blood Culture results may not be optimal due to an excessive volume of blood received in culture bottles   Culture   Final    NO GROWTH < 24 HOURS Performed at [facility], [address], [place], [place] [address]    Report Status PENDING  Incomplete  Culture, blood (Routine x 2)     Status: None (Preliminary result)   Collection Time: 12/23/2019  3:59 PM   Specimen: BLOOD  Result Value Ref Range Status   Specimen Description BLOOD LEFT ANTECUBITAL  Final   Special Requests   Final    BOTTLES DRAWN AEROBIC AND ANAEROBIC Blood Culture adequate volume   Culture   Final    NO GROWTH < 24 HOURS Performed at [facility], [address] [place], [place] [address]    Report Status PENDING  Incomplete  SARS Coronavirus 2 by RT PCR (hospital order, performed in [place] hospital lab) Nasopharyngeal Nasopharyngeal Swab     Status: None   Collection Time: 12/29/2019  8:16 PM  Specimen: Nasopharyngeal Swab  Result Value Ref Range Status   SARS Coronavirus 2 NEGATIVE NEGATIVE Final    Comment: (NOTE) SARS-CoV-2 target nucleic acids are NOT DETECTED.  The SARS-CoV-2 RNA is generally detectable in upper and lower respiratory specimens during the acute phase of infection. The lowest concentration of SARS-CoV-2 viral copies this assay can detect is 250 copies / mL. A negative result does not preclude SARS-CoV-2 infection and should not be used as the sole basis for treatment or other patient management decisions.  A negative result may occur with improper specimen collection / handling, submission of specimen other than nasopharyngeal swab, presence of viral mutation(s) within the areas targeted by this assay, and inadequate number of viral copies (<250 copies / mL). A negative result must be combined with clinical observations, patient history, and epidemiological information.  Fact Sheet for Patients:   StrictlyIdeas.no  Fact Sheet for Healthcare Providers: BankingDealers.co.za  This test is not yet approved or  cleared by the [place] FDA and has been authorized for detection and/or diagnosis of SARS-CoV-2  by FDA under an Emergency Use Authorization (EUA).  This EUA will remain in effect (meaning this test can be used) for the duration of the COVID-19 declaration under Section 564(b)(1) of the Act, 21 U.S.C. section 360bbb-3(b)(1), unless the authorization is terminated or revoked sooner.  Performed at [facility], [address] [place], [place] [address]          Radiology Studies: CT Head Wo Contrast  Result Date: 01/11/2020 CLINICAL DATA:  Encephalopathy. EXAM: CT HEAD WITHOUT CONTRAST TECHNIQUE: Contiguous axial images were obtained from the base of the skull through the vertex without intravenous contrast. COMPARISON:  01/29/2015 FINDINGS: Brain: Ventricles and cisterns are unchanged. Evidence of age related atrophic change. Chronic ischemic microvascular disease is present. There is no mass, mass effect, shift of midline structures or acute hemorrhage. No evidence of acute hemorrhage. Vascular: No hyperdense vessel or unexpected calcification. Skull: No acute fracture. Sinuses/Orbits: Orbits are normal. Paranasal sinuses are well developed and aerated. Other: None. IMPRESSION: 1.  No acute findings. 2.  Age related atrophy and chronic ischemic microvascular disease. Electronically Signed   By: Marin Olp M.D.   On: 12/27/2019 17:28   DG Chest Port 1 View  Result Date: 12/14/2019 CLINICAL DATA:  Altered mental status. EXAM: PORTABLE CHEST 1 VIEW COMPARISON:  None. FINDINGS: Cardiomegaly. Mild interstitial prominence suggesting mild edema. Cardiomegaly. The hila and mediastinum are unchanged. No pneumothorax. IMPRESSION: Cardiomegaly and mild edema. Electronically Signed   By: Dorise Bullion III M.D   On: 12/21/2019 16:11   CT Renal Stone Study  Result Date: 12/16/2019 CLINICAL DATA:  Hematuria EXAM: CT ABDOMEN AND PELVIS WITHOUT CONTRAST TECHNIQUE: Multidetector CT imaging of the abdomen and pelvis was performed following the standard protocol without IV contrast.  COMPARISON:  None. FINDINGS: Lower chest: Bibasilar subsegmental atelectasis. Coronary artery calcification. Calcified granuloma within the right middle lobe. Hepatobiliary: Numerous scattered punctate calcifications throughout the liver suggests sequela of chronic granulomatous disease. No focal liver lesion is identified. Gallbladder not identified, likely surgically absent. Pancreas: Pancreatic atrophy. No ductal dilatation or inflammatory changes. Spleen: Numerous splenic calcifications compatible with chronic granulomatous disease. Otherwise unremarkable. Adrenals/Urinary Tract: Unremarkable adrenal glands. Kidneys have an unremarkable noncontrast appearance. No renal stone. Mild bilateral hydroureteronephrosis. No ureteral calculi identified. Urinary bladder is mildly distended but appears otherwise unremarkable. Stomach/Bowel: Small hiatal hernia. No dilated loops of bowel. Extensive sigmoid diverticulosis. No pericolonic inflammatory changes. Vascular/Lymphatic: Scattered aortoiliac atherosclerotic calcifications  without aneurysm. No abdominopelvic lymphadenopathy. Reproductive: Large bilateral adnexal cystic lesions. Left adnexal cystic lesion measures 9.0 x 8.2 x 8.8 cm. Dominant right adnexal cystic lesion measures 7.5 x 6.3 x 5.8 cm which appears slightly multiloculated. Surgically absent uterus. Other: No free fluid.  No free air.  No abdominal wall hernia. Musculoskeletal: Degenerative lumbar spondylosis. No acute or significant osseous findings. IMPRESSION: 1. Large bilateral adnexal cystic lesions, measuring up to 8.8 cm on the left and 7.5 cm on the right. Findings are concerning for ovarian cystadenomas. Further evaluation with pelvic ultrasound can be performed as clinically indicated. 2. Distention of the urinary bladder with mild bilateral hydroureteronephrosis. No urinary tract calcification identified. Correlate for urinary retention. 3. Extensive sigmoid diverticulosis without evidence of  acute diverticulitis. 4. Small hiatal hernia. 5. Aortic atherosclerosis. (ICD10-I70.0). Electronically Signed   By: Davina Poke D.O.   On: 12/24/2019 17:41        Scheduled Meds:  heparin  5,000 Units Subcutaneous Q8H   Continuous Infusions:  0.9 % NaCl with KCl 40 mEq / L 75 mL/hr at 01/08/20 0312   cefTRIAXone (ROCEPHIN)  IV Stopped (01/02/20 2144)          Aline August, MD [facility] January 08, 2020, 7:21 AM

## 2020-01-13 DEATH — deceased

## 2021-07-10 IMAGING — CT CT RENAL STONE PROTOCOL
2 of 4 series · 15 of 46 positions shown, 17 images · non-contrast
Comparison: None.

CLINICAL DATA: Hematuria

EXAM:
CT ABDOMEN AND PELVIS WITHOUT CONTRAST
TECHNIQUE: Multidetector CT imaging of the abdomen and pelvis was performed
following the standard protocol without IV contrast.

[Series 2: stone full standard · axial · 0.73mm/px · z∈[-804,-370]mm · 12 of 95 slices shown, 14 images]
[im 4/95  soft-tissue]
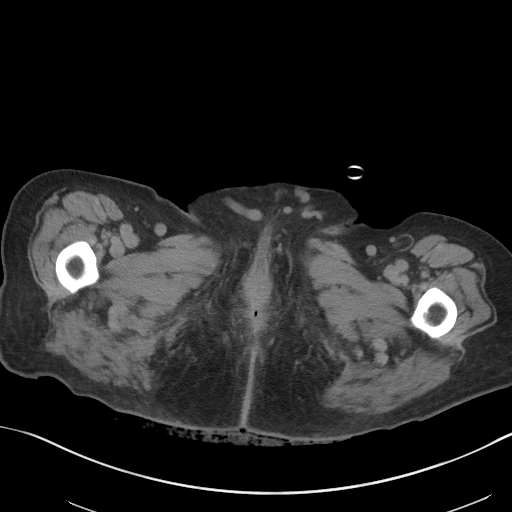
[im 4/95  bone]
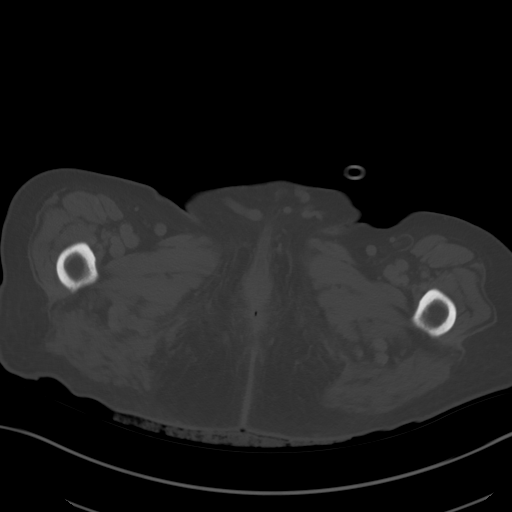
[im 12/95  soft-tissue]
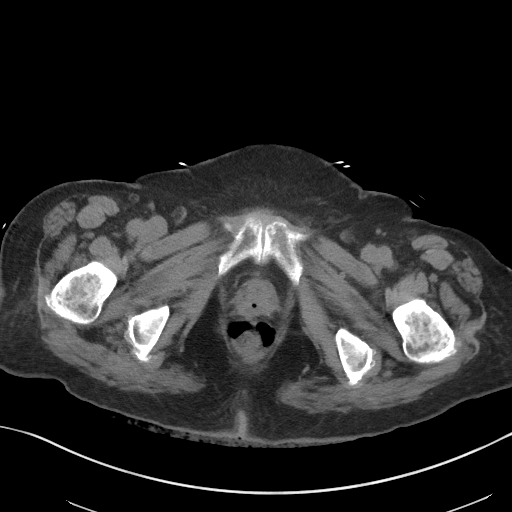
[im 20/95  soft-tissue]
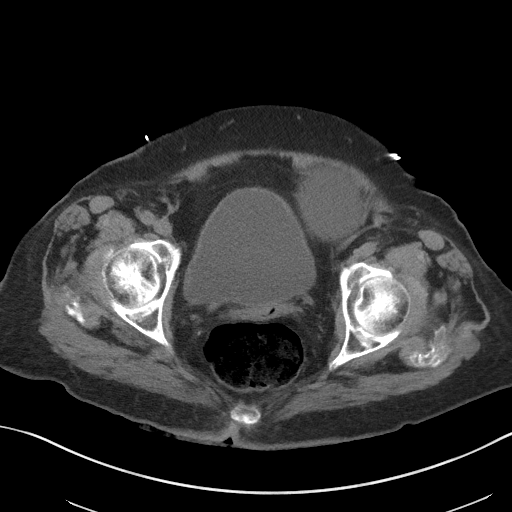
[im 28/95  soft-tissue]
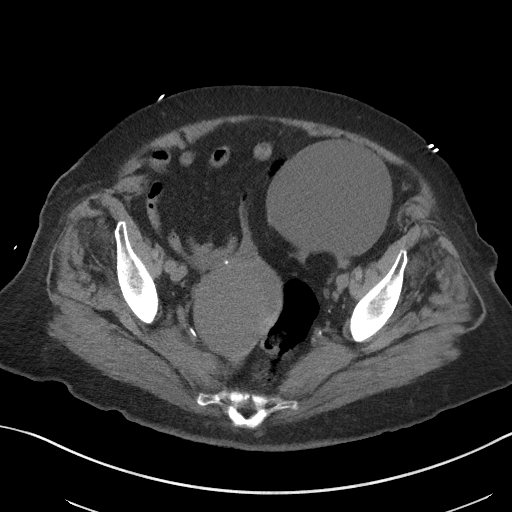
[im 36/95  soft-tissue]
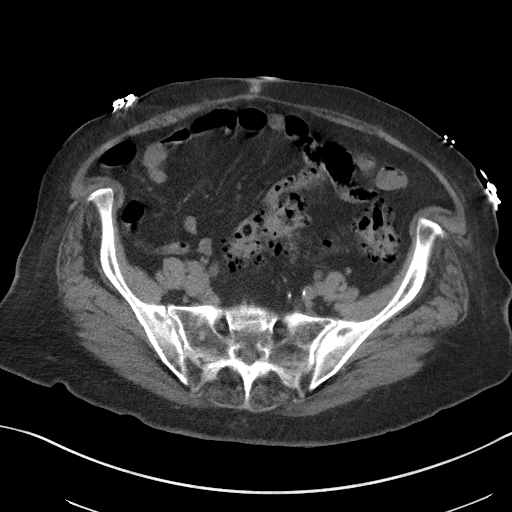
[im 44/95  soft-tissue]
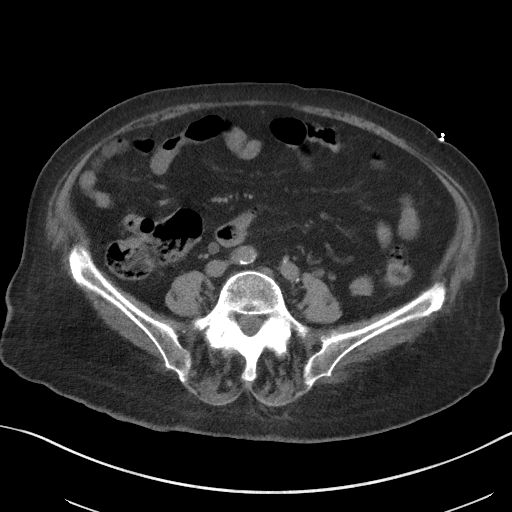
[im 51/95  soft-tissue]
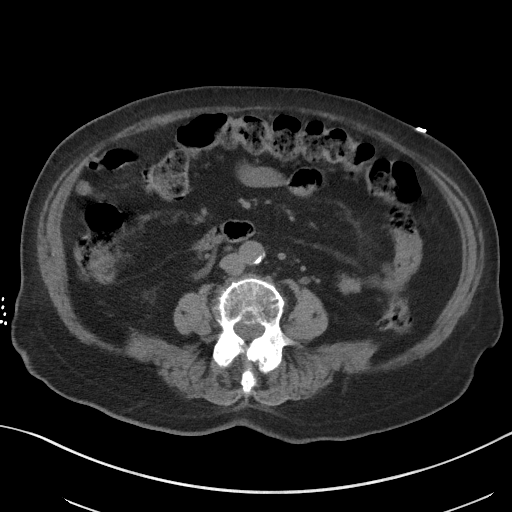
[im 59/95  soft-tissue]
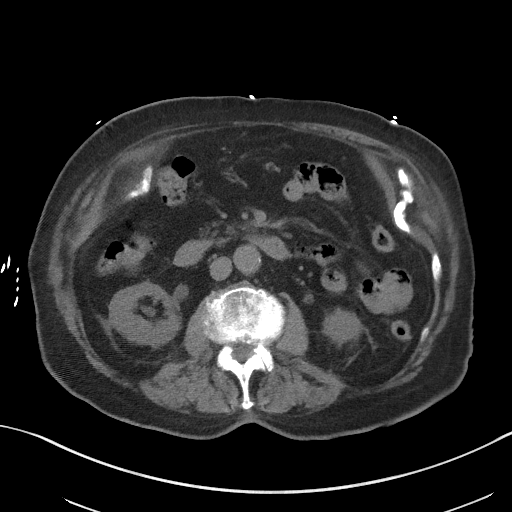
[im 67/95  soft-tissue]
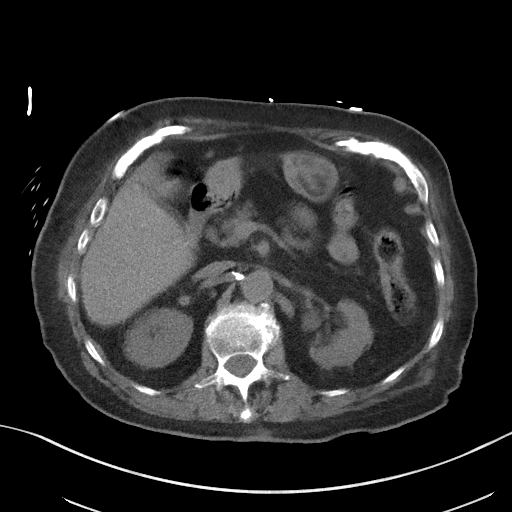
[im 67/95  bone]
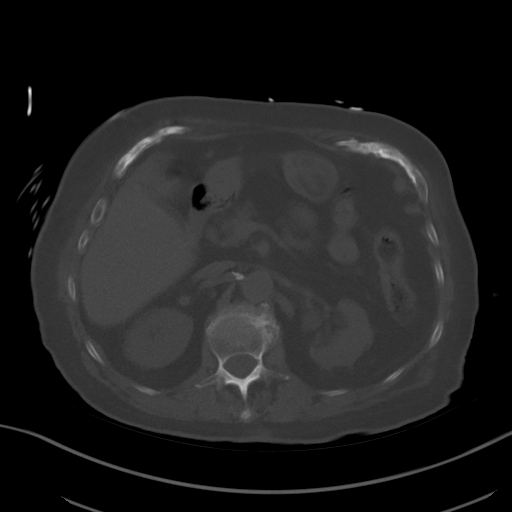
[im 75/95  soft-tissue]
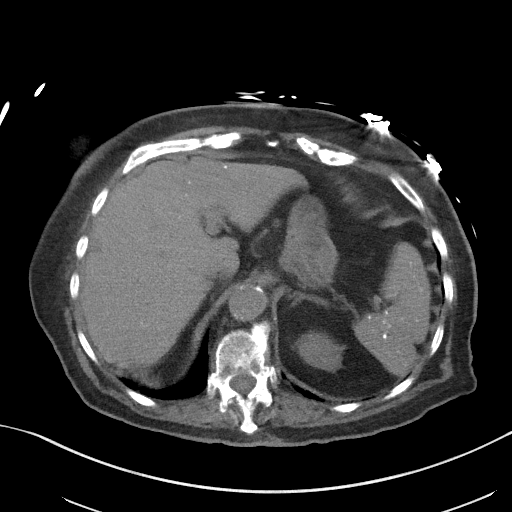
[im 83/95  soft-tissue]
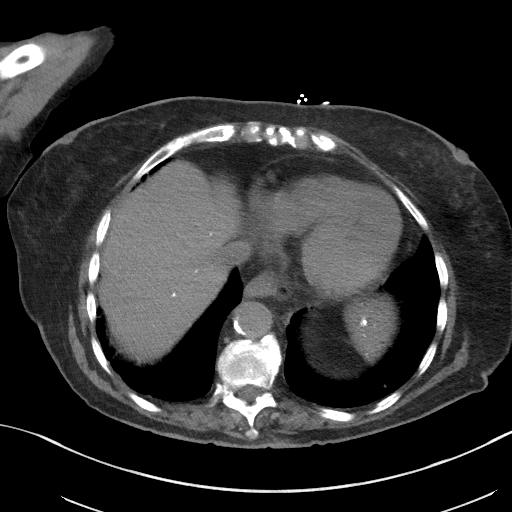
[im 91/95  soft-tissue]
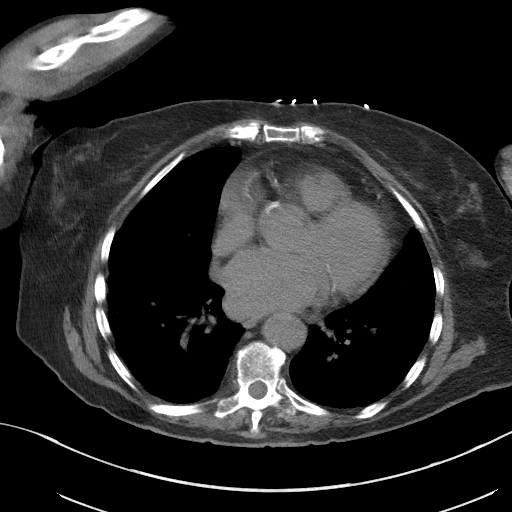

[Series 5: coronal · coronal · 0.71mm/px · 3 of 124 slices shown]
[im 42/124  soft-tissue]
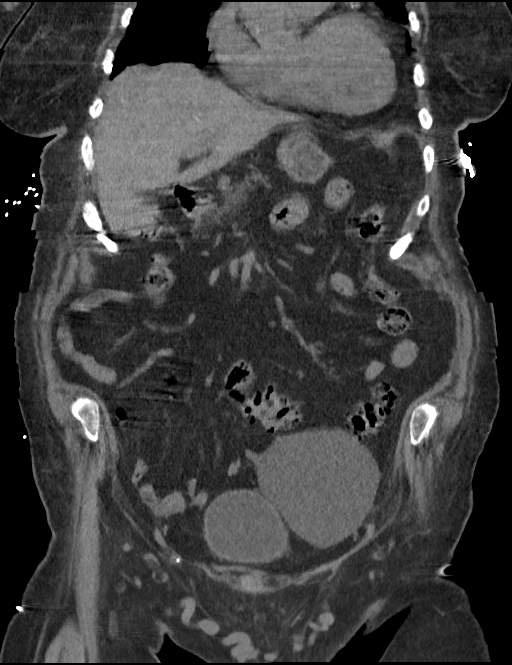
[im 55/124  soft-tissue]
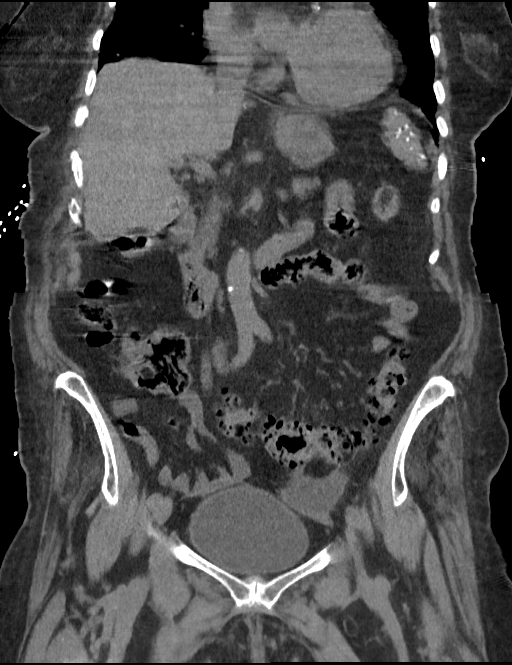
[im 69/124  soft-tissue]
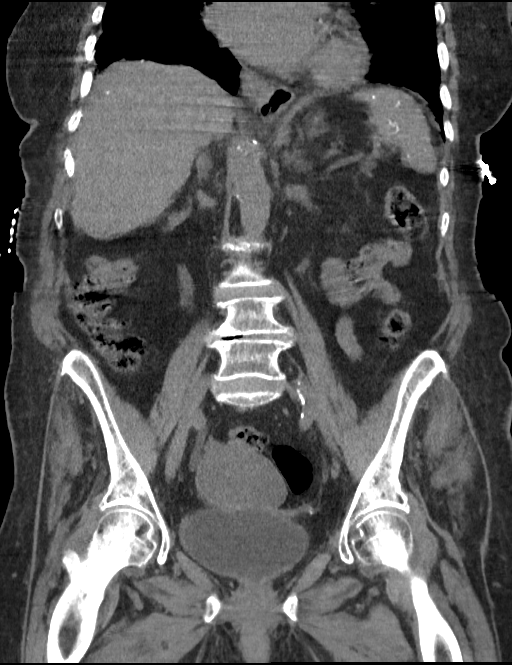

[15 of 46 positions shown; findings below may reference images not displayed]

FINDINGS: Lower chest: Bibasilar subsegmental atelectasis. Coronary artery
calcification. Calcified granuloma within the right middle lobe.

Hepatobiliary: Numerous scattered punctate calcifications throughout
the liver suggests sequela of chronic granulomatous disease. No
focal liver lesion is identified. Gallbladder not identified, likely
surgically absent.

Pancreas: Pancreatic atrophy. No ductal dilatation or inflammatory
changes.

Spleen: Numerous splenic calcifications compatible with chronic
granulomatous disease. Otherwise unremarkable.

Adrenals/Urinary Tract: Unremarkable adrenal glands. Kidneys have an
unremarkable noncontrast appearance. No renal stone. Mild bilateral
hydroureteronephrosis. No ureteral calculi identified. Urinary
bladder is mildly distended but appears otherwise unremarkable.

Stomach/Bowel: Small hiatal hernia. No dilated loops of bowel.
Extensive sigmoid diverticulosis. No pericolonic inflammatory
changes.

Vascular/Lymphatic: Scattered aortoiliac atherosclerotic
calcifications without aneurysm. No abdominopelvic lymphadenopathy.

Reproductive: Large bilateral adnexal cystic lesions. Left adnexal
cystic lesion measures 9.0 x 8.2 x 8.8 cm. Dominant right adnexal
cystic lesion measures 7.5 x 6.3 x 5.8 cm which appears slightly
multiloculated. Surgically absent uterus.

Other: No free fluid.  No free air.  No abdominal wall hernia.

Musculoskeletal: Degenerative lumbar spondylosis. No acute or
significant osseous findings.
IMPRESSION: 1. Large bilateral adnexal cystic lesions, measuring up to 8.8 cm on
the left and 7.5 cm on the right. Findings are concerning for
ovarian cystadenomas. Further evaluation with pelvic ultrasound can
be performed as clinically indicated.
2. Distention of the urinary bladder with mild bilateral
hydroureteronephrosis. No urinary tract calcification identified.
Correlate for urinary retention.
3. Extensive sigmoid diverticulosis without evidence of acute
diverticulitis.
4. Small hiatal hernia.
5. Aortic atherosclerosis. ([id number]).
